# Patient Record
Sex: Female | Born: 1945 | Race: White | Hispanic: No | Marital: Married | Smoking: Never smoker
Health system: Southern US, Community
[De-identification: ages and names within clinical notes are randomized; demographics above are authoritative.]

## PROBLEM LIST (undated history)

## (undated) DIAGNOSIS — R42 Dizziness and giddiness: Secondary | ICD-10-CM

## (undated) DIAGNOSIS — M7500 Adhesive capsulitis of unspecified shoulder: Secondary | ICD-10-CM

## (undated) DIAGNOSIS — G43909 Migraine, unspecified, not intractable, without status migrainosus: Secondary | ICD-10-CM

---

## 1997-12-31 ENCOUNTER — Ambulatory Visit (HOSPITAL_COMMUNITY): Admission: RE | Admit: 1997-12-31 | Discharge: 1997-12-31 | Payer: Self-pay | Admitting: Family Medicine

## 2000-01-22 ENCOUNTER — Other Ambulatory Visit: Admission: RE | Admit: 2000-01-22 | Discharge: 2000-01-22 | Payer: Self-pay | Admitting: Obstetrics and Gynecology

## 2000-02-02 ENCOUNTER — Encounter: Admission: RE | Admit: 2000-02-02 | Discharge: 2000-02-02 | Payer: Self-pay | Admitting: Obstetrics and Gynecology

## 2000-02-02 ENCOUNTER — Encounter: Payer: Self-pay | Admitting: Obstetrics and Gynecology

## 2001-03-16 ENCOUNTER — Other Ambulatory Visit: Admission: RE | Admit: 2001-03-16 | Discharge: 2001-03-16 | Payer: Self-pay | Admitting: Obstetrics and Gynecology

## 2002-05-25 ENCOUNTER — Encounter (INDEPENDENT_AMBULATORY_CARE_PROVIDER_SITE_OTHER): Payer: Self-pay | Admitting: Specialist

## 2002-05-25 ENCOUNTER — Ambulatory Visit (HOSPITAL_COMMUNITY): Admission: RE | Admit: 2002-05-25 | Discharge: 2002-05-25 | Payer: Self-pay | Admitting: Gastroenterology

## 2002-05-29 ENCOUNTER — Other Ambulatory Visit: Admission: RE | Admit: 2002-05-29 | Discharge: 2002-05-29 | Payer: Self-pay | Admitting: *Deleted

## 2002-08-06 ENCOUNTER — Encounter: Payer: Self-pay | Admitting: Obstetrics and Gynecology

## 2002-08-06 ENCOUNTER — Encounter: Admission: RE | Admit: 2002-08-06 | Discharge: 2002-08-06 | Payer: Self-pay | Admitting: *Deleted

## 2003-06-03 ENCOUNTER — Other Ambulatory Visit: Admission: RE | Admit: 2003-06-03 | Discharge: 2003-06-03 | Payer: Self-pay | Admitting: Obstetrics and Gynecology

## 2003-12-10 ENCOUNTER — Ambulatory Visit (HOSPITAL_COMMUNITY): Admission: RE | Admit: 2003-12-10 | Discharge: 2003-12-10 | Payer: Self-pay | Admitting: Family Medicine

## 2004-01-16 ENCOUNTER — Encounter: Admission: RE | Admit: 2004-01-16 | Discharge: 2004-01-16 | Payer: Self-pay | Admitting: Obstetrics and Gynecology

## 2004-06-03 ENCOUNTER — Other Ambulatory Visit: Admission: RE | Admit: 2004-06-03 | Discharge: 2004-06-03 | Payer: Self-pay | Admitting: Obstetrics and Gynecology

## 2005-05-28 ENCOUNTER — Encounter: Admission: RE | Admit: 2005-05-28 | Discharge: 2005-05-28 | Payer: Self-pay | Admitting: Obstetrics and Gynecology

## 2005-07-05 ENCOUNTER — Other Ambulatory Visit: Admission: RE | Admit: 2005-07-05 | Discharge: 2005-07-05 | Payer: Self-pay | Admitting: Obstetrics and Gynecology

## 2005-07-13 ENCOUNTER — Ambulatory Visit (HOSPITAL_COMMUNITY): Admission: RE | Admit: 2005-07-13 | Discharge: 2005-07-13 | Payer: Self-pay | Admitting: Gastroenterology

## 2006-02-21 ENCOUNTER — Ambulatory Visit: Payer: Self-pay | Admitting: Internal Medicine

## 2006-02-23 ENCOUNTER — Ambulatory Visit: Payer: Self-pay | Admitting: Internal Medicine

## 2006-02-25 ENCOUNTER — Inpatient Hospital Stay (HOSPITAL_COMMUNITY): Admission: EM | Admit: 2006-02-25 | Discharge: 2006-02-27 | Payer: Self-pay | Admitting: Emergency Medicine

## 2006-02-25 ENCOUNTER — Ambulatory Visit: Payer: Self-pay | Admitting: Internal Medicine

## 2006-03-03 ENCOUNTER — Ambulatory Visit: Payer: Self-pay

## 2006-03-11 ENCOUNTER — Ambulatory Visit: Payer: Self-pay | Admitting: Internal Medicine

## 2007-03-24 DIAGNOSIS — M810 Age-related osteoporosis without current pathological fracture: Secondary | ICD-10-CM | POA: Insufficient documentation

## 2007-03-24 DIAGNOSIS — Z860100 Personal history of colon polyps, unspecified: Secondary | ICD-10-CM | POA: Insufficient documentation

## 2007-07-18 ENCOUNTER — Other Ambulatory Visit: Admission: RE | Admit: 2007-07-18 | Discharge: 2007-07-18 | Payer: Self-pay | Admitting: Obstetrics & Gynecology

## 2009-01-14 ENCOUNTER — Encounter: Payer: Self-pay | Admitting: Internal Medicine

## 2009-02-13 ENCOUNTER — Other Ambulatory Visit: Admission: RE | Admit: 2009-02-13 | Discharge: 2009-02-13 | Payer: Self-pay | Admitting: Obstetrics and Gynecology

## 2009-02-13 ENCOUNTER — Encounter: Payer: Self-pay | Admitting: Internal Medicine

## 2009-02-24 ENCOUNTER — Ambulatory Visit (HOSPITAL_COMMUNITY): Admission: RE | Admit: 2009-02-24 | Discharge: 2009-02-24 | Payer: Self-pay | Admitting: Obstetrics and Gynecology

## 2009-02-24 ENCOUNTER — Encounter: Payer: Self-pay | Admitting: Obstetrics and Gynecology

## 2009-03-03 ENCOUNTER — Ambulatory Visit (HOSPITAL_COMMUNITY): Admission: RE | Admit: 2009-03-03 | Discharge: 2009-03-03 | Payer: Self-pay | Admitting: Obstetrics and Gynecology

## 2009-03-18 ENCOUNTER — Ambulatory Visit: Payer: Self-pay | Admitting: Internal Medicine

## 2009-03-18 DIAGNOSIS — K3189 Other diseases of stomach and duodenum: Secondary | ICD-10-CM | POA: Insufficient documentation

## 2009-03-18 DIAGNOSIS — R1013 Epigastric pain: Secondary | ICD-10-CM

## 2009-03-18 DIAGNOSIS — E785 Hyperlipidemia, unspecified: Secondary | ICD-10-CM | POA: Insufficient documentation

## 2009-03-18 DIAGNOSIS — E559 Vitamin D deficiency, unspecified: Secondary | ICD-10-CM | POA: Insufficient documentation

## 2009-03-18 LAB — CONVERTED CEMR LAB
Cholesterol, target level: 200 mg/dL
HDL goal, serum: 50 mg/dL

## 2009-03-20 ENCOUNTER — Ambulatory Visit: Payer: Self-pay | Admitting: Internal Medicine

## 2009-03-20 DIAGNOSIS — R142 Eructation: Secondary | ICD-10-CM

## 2009-03-20 DIAGNOSIS — K625 Hemorrhage of anus and rectum: Secondary | ICD-10-CM | POA: Insufficient documentation

## 2009-03-20 DIAGNOSIS — R143 Flatulence: Secondary | ICD-10-CM

## 2009-03-20 DIAGNOSIS — R141 Gas pain: Secondary | ICD-10-CM | POA: Insufficient documentation

## 2009-03-21 ENCOUNTER — Encounter (INDEPENDENT_AMBULATORY_CARE_PROVIDER_SITE_OTHER): Payer: Self-pay | Admitting: *Deleted

## 2009-03-21 LAB — CONVERTED CEMR LAB
Basophils Absolute: 0 10*3/uL (ref 0.0–0.1)
Basophils Relative: 0 % (ref 0.0–3.0)
Eosinophils Relative: 2.6 % (ref 0.0–5.0)
HCT: 39.5 % (ref 36.0–46.0)
MCV: 91.6 fL (ref 78.0–100.0)
Neutro Abs: 2.3 10*3/uL (ref 1.4–7.7)
WBC: 4.6 10*3/uL (ref 4.5–10.5)

## 2009-04-15 ENCOUNTER — Telehealth: Payer: Self-pay | Admitting: Internal Medicine

## 2009-07-22 ENCOUNTER — Encounter: Admission: RE | Admit: 2009-07-22 | Discharge: 2009-07-22 | Payer: Self-pay | Admitting: Obstetrics and Gynecology

## 2009-10-08 ENCOUNTER — Telehealth (INDEPENDENT_AMBULATORY_CARE_PROVIDER_SITE_OTHER): Payer: Self-pay | Admitting: *Deleted

## 2009-10-09 ENCOUNTER — Encounter: Payer: Self-pay | Admitting: Internal Medicine

## 2009-10-30 ENCOUNTER — Encounter: Payer: Self-pay | Admitting: Internal Medicine

## 2009-11-04 ENCOUNTER — Encounter: Payer: Self-pay | Admitting: Internal Medicine

## 2009-11-12 ENCOUNTER — Encounter: Payer: Self-pay | Admitting: Internal Medicine

## 2009-12-16 ENCOUNTER — Ambulatory Visit: Payer: Self-pay | Admitting: Internal Medicine

## 2009-12-18 LAB — CONVERTED CEMR LAB
Cholesterol: 246 mg/dL — ABNORMAL HIGH (ref 0–200)
Direct LDL: 190.3 mg/dL
HDL: 55.8 mg/dL (ref >39.00)
Total CHOL/HDL Ratio: 4
Triglycerides: 79 mg/dL (ref 0.0–149.0)

## 2009-12-24 ENCOUNTER — Encounter: Payer: Self-pay | Admitting: Internal Medicine

## 2009-12-29 ENCOUNTER — Ambulatory Visit: Payer: Self-pay | Admitting: Internal Medicine

## 2009-12-29 DIAGNOSIS — R42 Dizziness and giddiness: Secondary | ICD-10-CM | POA: Insufficient documentation

## 2010-01-05 ENCOUNTER — Telehealth (INDEPENDENT_AMBULATORY_CARE_PROVIDER_SITE_OTHER): Payer: Self-pay | Admitting: *Deleted

## 2010-02-18 ENCOUNTER — Encounter: Admission: RE | Admit: 2010-02-18 | Discharge: 2010-02-18 | Payer: Self-pay | Admitting: Obstetrics and Gynecology

## 2010-08-11 ENCOUNTER — Telehealth (INDEPENDENT_AMBULATORY_CARE_PROVIDER_SITE_OTHER): Payer: Self-pay | Admitting: *Deleted

## 2010-09-25 ENCOUNTER — Telehealth: Payer: Self-pay | Admitting: Internal Medicine

## 2010-10-09 ENCOUNTER — Telehealth: Payer: Self-pay | Admitting: Internal Medicine

## 2010-10-16 ENCOUNTER — Ambulatory Visit: Payer: Self-pay | Admitting: Internal Medicine

## 2010-10-16 LAB — CONVERTED CEMR LAB
ALT: 15 units/L (ref 0–35)
AST: 17 units/L (ref 0–37)
Alkaline Phosphatase: 37 units/L — ABNORMAL LOW (ref 39–117)
BUN: 16 mg/dL (ref 6–23)
CO2: 27 meq/L (ref 19–32)
Eosinophils Absolute: 0.1 10*3/uL (ref 0.0–0.7)
HCT: 39.2 % (ref 36.0–46.0)
Hemoglobin: 13.4 g/dL (ref 12.0–15.0)
Lymphocytes Relative: 28.9 % (ref 12.0–46.0)
Monocytes Relative: 8.4 % (ref 3.0–12.0)
Neutrophils Relative %: 60.4 % (ref 43.0–77.0)
Platelets: 243 10*3/uL (ref 150.0–400.0)
Potassium: 4.3 meq/L (ref 3.5–5.1)
RBC: 4.28 M/uL (ref 3.87–5.11)
RDW: 13.4 % (ref 11.5–14.6)
Sodium: 141 meq/L (ref 135–145)
TSH: 1.54 microintl units/mL (ref 0.35–5.50)

## 2010-10-20 ENCOUNTER — Ambulatory Visit: Payer: Self-pay | Admitting: Internal Medicine

## 2010-10-20 ENCOUNTER — Encounter: Payer: Self-pay | Admitting: Internal Medicine

## 2010-10-20 DIAGNOSIS — R519 Headache, unspecified: Secondary | ICD-10-CM | POA: Insufficient documentation

## 2010-10-20 DIAGNOSIS — H8110 Benign paroxysmal vertigo, unspecified ear: Secondary | ICD-10-CM | POA: Insufficient documentation

## 2010-10-20 DIAGNOSIS — R51 Headache: Secondary | ICD-10-CM

## 2010-10-21 ENCOUNTER — Telehealth (INDEPENDENT_AMBULATORY_CARE_PROVIDER_SITE_OTHER): Payer: Self-pay | Admitting: *Deleted

## 2010-12-21 ENCOUNTER — Encounter: Payer: Self-pay | Admitting: Obstetrics and Gynecology

## 2010-12-29 NOTE — Letter (Signed)
Summary: Hermelinda Medicus MD ENT  Hermelinda Medicus MD ENT   Imported By: Lanelle Bal 12/26/2009 08:34:10  _____________________________________________________________________  External Attachment:    Type:   Image     Comment:   External Document

## 2010-12-29 NOTE — Progress Notes (Signed)
Summary: Med refill  Phone Note Call from Patient Call back at [facility] Phone [phone]   Summary of Call: Patient called the office about denial of her Diazepam, I made her aware of [facility] previous recommendations and patient states that our office has had an incorrect phone number. Patient will be coming home next week and has schedule appt for lab and cpx. She would like enough Diazepam to get her until the appt. Please advise. Initial call taken by: Lucious Groves CMA,  October 09, 2010 4:22 PM  Follow-up for Phone Call        Per MD the patient can be given #30 Diazepam with no refills, and patient must keep labs and cpx appt.  Rx was called in and I confirmed with pharmacy that they number they have for the patient is [phone]. Left message on voicemail to call back to office.  Follow-up by: Lucious Groves CMA,  October 09, 2010 4:32 PM  Additional Follow-up for Phone Call Additional follow up Details #1::        NOTE RX WAS CALLED TO PHARMACY IN [place], NOT LOCAL.  Patient notified. Additional Follow-up by: Lucious Groves CMA,  October 09, 2010 4:47 PM    Prescriptions: DIAZEPAM 5 MG TABS (DIAZEPAM) 1/2  every 8 -12 hrs as needed for "prodrome"  #30 x 0   Entered by:   Lucious Groves CMA   Authorized by:   Marga Melnick MD   Signed by:   Lucious Groves CMA on 10/09/2010   Method used:   Telephoned to ...       [facility] [address] [facility] [address] [id number] (retail)       [address]       [place], [place]  [address]       Ph: [phone]       Fax: [phone]   RxID:   [id number]

## 2010-12-29 NOTE — Progress Notes (Signed)
Summary: phenergan request--Unable to reach patient  Phone Note Call from Patient   Summary of Call: Patient left message on triage that she is still in [place] b/c that is where her husband is working. She notes that she continues to have a problem with Vertigo and Migraines. Patient requests refill of Phenergan be sent to the same [facility] as last time. Please advise. Initial call taken by: Lucious Groves CMA,  September 25, 2010 11:57 AM  Follow-up for Phone Call        Phenergan suppositories 12.5 mg # 5.Unfortunately I will not be able to  continue refill meds  because she has not been seen since 11/2009 & followup of vit D deficiency  & cholesterol were due in 03/2010. The [place] [employer] Board will not sanction me  refilling meds on ongoing basis  with her being out of state as that is not Standard of Care as per the Board. She needs to establish with a MD there who can monitor these issues to protect her health. Follow-up by: Marga Melnick MD,  September 25, 2010 12:58 PM  Additional Follow-up for Phone Call Additional follow up Details #1::        Attempted to reach patient and the phone will ring and then start beeping. Must try again. Lucious Groves CMA  September 25, 2010 3:37 PM   Faxed prescription and attempted to contact the patient again. Same as above. Lucious Groves CMA  September 25, 2010 5:02 PM   Tried to contact pt still unable to reach will try again later............Marland KitchenFelecia Deloach CMA  September 28, 2010 9:18 AM     Additional Follow-up for Phone Call Additional follow up Details #2::    Still unable to contact the patient, phone rang and then cuts off. Lucious Groves CMA  September 29, 2010 8:50 AM   Still the same, phone rings then cuts off. Cannot send note b/c patient is out of state. Closed phone note until patient calls again. Lucious Groves CMA  September 30, 2010 3:24 PM   New/Updated Medications: PROMETHEGAN 12.5 MG SUPP (PROMETHAZINE HCL) use as directed Prescriptions: PROMETHEGAN  12.5 MG SUPP (PROMETHAZINE HCL) use as directed  #5 x 0   Entered by:   Lucious Groves CMA   Authorized by:   Marga Melnick MD   Signed by:   Lucious Groves CMA on 09/25/2010   Method used:   Printed then faxed to ...       [facility] [id number]* (retail)       [address]       [place], [place]  [address]       Ph: [phone]       Fax: [phone]   RxID:   [id number]

## 2010-12-29 NOTE — Letter (Signed)
Summary: 11-05-09 thru 12-16-09 Kathline Magic MD  11-05-09 thru 12-16-09 Kathline Magic MD   Imported By: Lanelle Bal 01/05/2010 09:12:13  _____________________________________________________________________  External Attachment:    Type:   Image     Comment:   External Document

## 2010-12-29 NOTE — Assessment & Plan Note (Signed)
Summary: 4 mth fu/discuss labs/kdc   Vital Signs:  Patient profile:   65 year old female Weight:      132.8 pounds Pulse rate:   84 / minute Resp:     16 per minute BP sitting:   122 / 80  (left arm) Cuff size:   large  Vitals Entered By: Shonna Chock (December 29, 2009 4:32 PM) CC: 1.) F/U on labs  2.) Follow-up from [facility] Comments REVIEWED MED LIST, PATIENT AGREED DOSE AND INSTRUCTION CORRECT    CC:  1.) F/U on labs  2.) Follow-up from [facility].  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  Sensation of claustrophobia & being "hot" as "prodrome " before vertigo.Onset  while driving w/o trigger . Dr Allayne Stack & Dr Ewell Poe notes reviewed. No change in symptoms on 60 mg Prednisone daily. She questions role of lipids. Lorazepam helped vertigo in [place] in 08/2009. Lipids reviewed : LDL 190.3 with TG 79, HDL 21.30. NMR  2007 reviewed : LDL goal = < 150. PMH of Lipitor intolerance in 2003. On low salt , low caffeine diet.  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 Neuro:  Complains of visual disturbances; denies brief paralysis, numbness, tingling, and weakness; Dr Sandria Manly questioned migraine variant . Floaters .  Physical Exam  General:  well-nourished,in no acute distress; alert,appropriate and cooperative throughout examination Eyes:  No corneal or conjunctival inflammation noted. EOMI. Perrla.  No nystagmus.Minimal ptosis OS Lungs:  Normal respiratory effort, chest expands symmetrically. Lungs are clear to auscultation, no crackles or  wheezes. Heart:  Normal rate and regular rhythm. S1 and S2 normal without gallop, murmur, click, rub or other extra sounds. Pulses:  R and L carotid,radial,dorsalis pedis and posterior tibial pulses are full and equal bilaterally Neurologic:  alert & oriented X3, strength normal in all extremities, and DTRs symmetrical and normal.   Skin:  Intact without suspicious lesions or rashes Psych:  memory intact for recent and remote and good eye contact.     Impression & Recommendations:  Problem # 1:  VERTIGO (ICD-780.4)  Her updated medication list for this problem includes:    Promethazine Hcl 12.5 Mg Supp (Promethazine hcl) .Marland Kitchen... As needed    Dramamine 50 Mg Tabs (Dimenhydrinate) .Marland Kitchen... As needed    Phenadoz 12.5 Mg Supp (Promethazine hcl) .Marland Kitchen... 1 q 8-12 hrs as needed  Problem # 2:  HYPERLIPIDEMIA (ICD-272.4) LDL climbing  Problem # 3:  VITAMIN D DEFICIENCY (ICD-268.9) off 50,000 International Units once weekly  X 3 months( she feared  it was aggravating vertigo); vit D 24 after 6 months of vit D  Complete Medication List: 1)  Vitamin D 86578 Unit Caps (Ergocalciferol) .Marland Kitchen.. 1 by mouth every monday 2)  Ranitidine Hcl 150 Mg Tabs (Ranitidine hcl) .Marland Kitchen.. 1 every 12 hrs pre meals two times a day prn 3)  Prednisone 20 Mg Tabs (Prednisone) .... 3 by mouth once daily 4)  Promethazine Hcl 12.5 Mg Supp (Promethazine hcl) .... As needed 5)  Dramamine 50 Mg Tabs (Dimenhydrinate) .... As needed 6)  Diazepam 5 Mg Tabs (Diazepam) .... 1/2  every 8 -12  hrs as needed for "prodrome" 7)  Phenadoz 12.5 Mg Supp (Promethazine hcl) .Marland Kitchen.. 1 q 8-12 hrs as needed  Patient Instructions: 1)  Vitamin D3  2000 International Units once daily . REad Dr Gildardo Griffes Eat, Drink & Be Healthy . 2)  Please schedule a follow-up appointment in 3 months. 3)  NMR Lipoprofile Lipid Panel prior to visit, ICD-9:272.4 4)  HbgA1C prior to visit, ICD-9:995.20, 272.4 Prescriptions: PHENADOZ 12.5 MG SUPP (PROMETHAZINE HCL) 1 q 8-12 hrs  as needed  #12 x 0   Entered and Authorized by:   Marga Melnick MD   Signed by:   Marga Melnick MD on 12/29/2009   Method used:   Print then Give to Patient   RxID:   [id number] DIAZEPAM 5 MG TABS (DIAZEPAM) 1/2  every 8 -12 hrs as needed for "prodrome"  #30 x 2   Entered and Authorized by:   Marga Melnick MD   Signed by:   Marga Melnick MD on 12/29/2009   Method used:   Print then Give to Patient   RxID:   [id number]   Appended Document: Orders Update    Clinical Lists Changes  Orders: Added new Test order of Durable Medical Equipment (DME) - Signed

## 2010-12-29 NOTE — Letter (Signed)
Summary: Hermelinda Medicus MD  Hermelinda Medicus MD   Imported By: Lanelle Bal 01/05/2010 09:09:45  _____________________________________________________________________  External Attachment:    Type:   Image     Comment:   External Document

## 2010-12-29 NOTE — Progress Notes (Signed)
Summary: refill too early   Phone Note Refill Request Message from:  Fax from Pharmacy  Refills Requested: Medication #1:  DIAZEPAM 5 MG TABS 1/2  every 8 -12 hrs as needed for "prodrome" rx written [phone] - note from [facility] [address] -fax [phone] - tel [phone] "please check with md if 7 days early refill ok. According to her profile no early refills in past."  Initial call taken by: Okey Regal [place],  October 21, 2010 10:57 AM  Follow-up for Phone Call        Spoke with Leonie Man and informed her ok to fill 7days early Follow-up by: Shonna Chock CMA,  October 21, 2010 11:11 AM

## 2010-12-29 NOTE — Letter (Signed)
Summary: 10-16-09 & 10-30-09 Okey Regal MD  10-16-09 & 10-30-09 Okey Regal MD   Imported By: Lanelle Bal 01/05/2010 09:10:45  _____________________________________________________________________  External Attachment:    Type:   Image     Comment:   External Document

## 2010-12-29 NOTE — Assessment & Plan Note (Signed)
Summary: CPX/kb   Vital Signs:  Patient profile:   65 year old female Height:      63 inches Weight:      132.4 pounds BMI:     23.54 Temp:     97.6 degrees F oral Pulse rate:   76 / minute Resp:     14 per minute BP sitting:   118 / 72  (left arm) Cuff size:   large  Vitals Entered By: Shonna Chock CMA (October 20, 2010 3:55 PM) CC: CPX and discuss labs (copy given)    CC:  CPX and discuss labs (copy given) .  History of Present Illness:      Kelly Rocha is here for a physical; she has had recurrence of  migraines & BPV symptoms in the context of significant life stresses. She has been in [place]  for a protracted time  related to  her husband's job .The evaluations completed by ENT, Neuro & Rheumatology were reviewed.  Current Medications (verified): 1)  Vitamin D 32440 Unit Caps (Ergocalciferol) .Marland Kitchen.. 1 By Mouth Every Monday 2)  Ranitidine Hcl 150 Mg Tabs (Ranitidine Hcl) .Marland Kitchen.. 1 Every 12 Hrs Pre Meals Two Times A Day Prn 3)  Diazepam 5 Mg Tabs (Diazepam) .... 1/2  Every 8 -12 Hrs As Needed For "prodrome" 4)  Promethegan 12.5 Mg Supp (Promethazine Hcl) .... Use As Directed  Allergies (verified): No Known Drug Allergies  Past History:  Past Medical History: Colonic polyps, PMH  of, Dr Madilyn Fireman Osteoporosis; Vitamin D deficiency  Hyperlipidemia: NMR Lipoprofile : 165(1417/25), HDL 68, TG 96. LDL goal = < 150. 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 migraine variant, Dr Sandria Manly  Past Surgical History: G3 P3; Chest wall cystectomy;C section X2; Hysteroscopy for cervica/ uterine l polyps Colon polypectomy 2004, negative  2007, Dr Madilyn Fireman  Family History: Father: MI @ 69,Cardiomyopathy Mother: stomach cancer, lipids Siblings: sister: HH,GERD, OA; son :HTN, lipids; MGM: melanoma daughter : MS; son: post traumatic cns injury  Social History: Married Never Smoked Alcohol use: no Regular exercise: no due to  BPV; Atkin's type diet when LDL  was 190.("I married a [place]!")  Review of Systems General:  Complains of fatigue; denies chills, fever, sweats, and weight loss. Eyes:  Denies double vision and vision loss-both eyes; Blurring of eyes with headache; diplopia X 1 episode with vertigo & headache. ENT:  Complains of hoarseness and ringing in ears; denies decreased hearing and difficulty swallowing; Tinnitus in Sept & Oct  before vertigo. CV:  Denies chest pain or discomfort, leg cramps with exertion, shortness of breath with exertion, swelling of feet, and swelling of hands; Rare "flip" to beat. Resp:  Denies cough, shortness of breath, and sputum productive. GI:  Denies abdominal pain, bloody stools, dark tarry stools, and indigestion; Colonoscopy next week for rectal bleeding ( in 12/2009). GU:  Denies discharge, dysuria, and hematuria. MS:  Denies joint pain, joint redness, and joint swelling. Derm:  Denies changes in nail beds, dryness, hair loss, and lesion(s). Neuro:  Denies brief paralysis, numbness, tingling, tremors, and weakness. Psych:  Complains of anxiety; denies depression, easily angered, easily tearful, and irritability; her daughter who has MS & son with cns trauma both live @ home.  Physical Exam  General:  well-nourished;alert,appropriate and cooperative throughout examination Head:  Normocephalic and atraumatic without obvious abnormalities.  Eyes:  No corneal or conjunctival inflammation noted. EOMI. Perrla. Funduscopic exam benign, without hemorrhages,  exudates or papilledema. Field of  Vision grossly normal. Ears:  External ear exam shows no significant lesions or deformities.  Otoscopic examination reveals clear canals, tympanic membranes are intact bilaterally without bulging, retraction, inflammation or discharge. Hearing is grossly normal bilaterally. Nose:  External nasal examination shows no deformity or inflammation. Nasal mucosa are pink and moist without lesions or  exudates. Mouth:  Oral mucosa and oropharynx without lesions or exudates.  Teeth in good repair. Neck:  No deformities, masses, or tenderness noted. Lungs:  Normal respiratory effort, chest expands symmetrically. Lungs are clear to auscultation, no crackles or wheezes. Heart:  Normal rate and regular rhythm. S1 and S2 normal without gallop, murmur, click, rub or other extra sounds. Abdomen:  Bowel sounds positive,abdomen soft and non-tender without masses, organomegaly or hernias noted. Genitalia:  Dr Tresa Res  Msk:  No deformity or scoliosis noted of thoracic or lumbar spine.   Pulses:  R and L carotid,radial,dorsalis pedis and posterior tibial pulses are full and equal bilaterally Extremities:  No clubbing, cyanosis, edema, or deformity noted with normal full range of motion of all joints.   Neurologic:  alert & oriented X3, cranial nerves II-XII intact, strength normal in all extremities, sensation intact to light touch, and DTRs symmetrical and normal.   Skin:  Intact without suspicious lesions or rashes Cervical Nodes:  No lymphadenopathy noted Axillary Nodes:  No palpable lymphadenopathy Psych:  memory intact for recent and remote, normally interactive, and good eye contact.     Impression & Recommendations:  Problem # 1:  PHYSICAL EXAMINATION (ICD-V70.0)  Orders: EKG w/ Interpretation (93000)  Problem # 2:  VERTIGO (ICD-780.4) Prior evaluation by Dr Haroldine Laws, ENT The following medications were removed from the medication list:    Promethazine Hcl 12.5 Mg Supp (Promethazine hcl) .Marland Kitchen... As needed    Dramamine 50 Mg Tabs (Dimenhydrinate) .Marland Kitchen... As needed    Phenadoz 12.5 Mg Supp (Promethazine hcl) .Marland Kitchen... 1 q 8-12 hrs as needed Her updated medication list for this problem includes:    Promethegan 12.5 Mg Supp (Promethazine hcl) ..... Use as directed  Problem # 3:  HEADACHE (ICD-784.0) flare with life stresses; PMH of Neuro evaluation by Dr Sandria Manly  Problem # 4:  HYPERLIPIDEMIA  (ICD-272.4) LDL goal = < 150, elevated @ last evaluation ? due to  increased meat intake  Complete Medication List: 1)  Vitamin D 16109 Unit Caps (Ergocalciferol) .Marland Kitchen.. 1 by mouth every monday 2)  Ranitidine Hcl 150 Mg Tabs (Ranitidine hcl) .Marland Kitchen.. 1 every 12 hrs pre meals two times a day prn 3)  Diazepam 5 Mg Tabs (Diazepam) .... 1/2  every 8 -12 hrs as needed for "prodrome" 4)  Promethegan 12.5 Mg Supp (Promethazine hcl) .... Use as directed  Patient Instructions: 1)  Review Dr Gildardo Griffes book Eat, Drink & Be Healthy; after 4 months repeat fasting lipids( 272.4) Prescriptions: PROMETHEGAN 12.5 MG SUPP (PROMETHAZINE HCL) use as directed  #5 x 1   Entered and Authorized by:   Marga Melnick MD   Signed by:   Marga Melnick MD on 10/20/2010   Method used:   Print then Give to Patient   RxID:   [id number] DIAZEPAM 5 MG TABS (DIAZEPAM) 1/2  every 8 -12 hrs as needed for "prodrome"  #90 x 1   Entered and Authorized by:   Marga Melnick MD   Signed by:   Marga Melnick MD on 10/20/2010   Method used:   Print then Give to Patient   RxID:   [id number]  Orders Added: 1)  Est. Patient 40-64 years [99396] 2)  EKG w/ Interpretation [93000]

## 2010-12-29 NOTE — Progress Notes (Signed)
Summary: refill  Phone Note Refill Request Message from:  Fax from Pharmacy on [facility] [address] fax [phone]  Refills Requested: Medication #1:  RANITIDINE HCL 150 MG TABS 1 every 12 hrs pre meals two times a day prn Initial call taken by: Barb Merino,  January 05, 2010 1:55 PM    Prescriptions: RANITIDINE HCL 150 MG TABS (RANITIDINE HCL) 1 every 12 hrs pre meals two times a day prn  #60 x 11   Entered by:   Shonna Chock   Authorized by:   Marga Melnick MD   Signed by:   Shonna Chock on 01/05/2010   Method used:   Electronically to        [facility] [address] (retail)       [phone]       [place], [place]  [address]       Ph: [phone]       Fax: [phone]   RxID:   [id number]

## 2010-12-29 NOTE — Progress Notes (Signed)
Summary: rx for phenergan  Phone Note Call from Patient Call back at Home Phone [phone]   Caller: Patient Summary of Call: patient called says she is currently in [place], [place] and says that she takes uses phenergan supp for migraines and would like to have prescription called in to pharmacy [facility] in [place] [phone] Initial call taken by: Doristine Devoid CMA,  August 11, 2010 3:12 PM  Follow-up for Phone Call        #5 Rxed on recorder as Pharmacist never picked up Follow-up by: Marga Melnick MD,  August 11, 2010 5:55 PM  Additional Follow-up for Phone Call Additional follow up Details #1::        I tried to call patient several times to inform her meds called in, the call would not go through.  I will try again later Additional Follow-up by: Shonna Chock CMA,  August 12, 2010 8:27 AM    Additional Follow-up for Phone Call Additional follow up Details #2::    I tried to reach patient again, call still would not go through. Phone would ring a few times and then just hang up Follow-up by: Shonna Chock CMA,  August 12, 2010 12:59 PM  Additional Follow-up for Phone Call Additional follow up Details #3:: Details for Additional Follow-up Action Taken: I tried to reach patient again, call unable to go through, If patient calls back I will let her know rx was called in .Shonna Chock CMA  August 13, 2010 1:32 PM

## 2011-01-01 NOTE — Letter (Signed)
Summary: [facility]   Imported By: Lanelle Bal 01/02/2010 14:34:54  _____________________________________________________________________  External Attachment:    Type:   Image     Comment:   External Document

## 2011-03-11 LAB — CBC
MCHC: 34.1 g/dL (ref 30.0–36.0)
Platelets: 256 10*3/uL (ref 150–400)

## 2011-03-12 ENCOUNTER — Encounter: Payer: Self-pay | Admitting: Internal Medicine

## 2011-03-12 ENCOUNTER — Ambulatory Visit (INDEPENDENT_AMBULATORY_CARE_PROVIDER_SITE_OTHER): Payer: Self-pay | Admitting: Internal Medicine

## 2011-03-12 DIAGNOSIS — E785 Hyperlipidemia, unspecified: Secondary | ICD-10-CM

## 2011-03-12 DIAGNOSIS — R51 Headache: Secondary | ICD-10-CM

## 2011-03-12 DIAGNOSIS — R002 Palpitations: Secondary | ICD-10-CM

## 2011-03-12 DIAGNOSIS — R42 Dizziness and giddiness: Secondary | ICD-10-CM

## 2011-03-12 LAB — POTASSIUM: Potassium: 5.2 mEq/L (ref 3.5–5.3)

## 2011-03-12 MED ORDER — PROMETHAZINE HCL 25 MG RE SUPP: 25.0000 mg | Freq: Four times a day (QID) | RECTAL | Status: DC | PRN

## 2011-03-12 NOTE — Assessment & Plan Note (Signed)
NMR Lipoprofile:LDL 165(1417/25), HDL 68, TG 96. LDL gola= < 150, ideally < 110.

## 2011-03-12 NOTE — Progress Notes (Signed)
Subjective:    Patient ID: Kelly Rocha, female    DOB: 02/21/1946, 65 y.o.   MRN: [id number]  HPI Dyslipidemia assessment   : She has been off any statin for 10 years.                                               Prior advanced Lipid testing : NMR Lipoprofile 2007:LDL 165(1417 / 25), HDL 68, TG 96. Results were reviewed & discussed. [place] Heart Panel none to date .                                                      Family history of premature CAD/ KZ:SWFUX ; but her  Father had MI  @ 71                                                                               Nutrition: "[place] food " X 1 year( increased meat)   Exercise: no  Diabetes : no   Smoking : never   HTN: no     Weight :  stable               ROS: chest pain : non exertional tightness occasionally ;claudication: no; palpitations: increased in past 10 days; syncope : in 12/2010 with migraine ; skin/ hair/ nail  changes: no; intermittent lightheadedness & dizziness . The dizziness has been evaluated by Dr Haroldine Laws , ENT & Dr Sandria Manly , Neuro.  Her migraines are poorly controlled. One episode was so severe she had visual changes and paresthesias in the extremity and severe nausea and vomiting. I stressed that she should go back to Dr. Sandria Manly or seek a second opinion from  a headache clinic.  The Diazepam  has helped her dizziness until recently. She has added over-the-counter anti-vertigo medications to supplement the Diazepam.    Review of Systems:      Objective:   Physical Exam on exam she is in no acute distress. Her speech seems slightly slurred. She has some difficulty providing history and focusing on questions.  She has no scleral icterus or jaundice.  Extraocular motion was intact. No nystagmus was present.  No carotid bruits were noted. All pulses were intact.  An S4 was noted; she had no significant murmurs or gallops.  Chest was clear to auscultation with no rales, rhonchi, or wheezes.  Extremities revealed no cyanosis,  edema, or clubbing.  She was very critical of herself stating that she had "failed many in reference to exercise and nutrition interventions.  She had difficulty grasping concepts that although her LDL was very high; the particle composition suggested that an LDL of 150 would be an  adequate goal. She kept focusing on the fact that her triglycerides have been elevated at the last lipid profile.        Assessment & Plan:  #1 Dyslipidemia  #2 palpitations  #  3 migraines 4 controlled  #4 dizziness; most likely part of the migraine picture  Plan: #1 EKG  #2 potassium, magnesium, calcium, and TSH to evaluate the palpitations   #3 a low glycemic carb diet was  recommended such as the book The New Sugar Busters. Avoidance of high fructose corn syrup sugar was stressed..  An advanced cholesterol panel ([place] heart panel) series will be ordered to optimally assess her long-term lipid risk. I feel this is necessary as she seems to be fixated on cholesterol risk despite review of the NMR Lipoprofile Panel results l results.

## 2011-03-12 NOTE — Patient Instructions (Signed)
The most common cause of elevated triglycerides is the ingestion of sugar from high fructose corn syrup sources. You should consume less than 40 grams  of sugar per day from foods and drinks with high fructose corn syrup as number 2, 3, or #4 on the label.  Please schedule a fasting Boston panel (1304X)

## 2011-03-15 ENCOUNTER — Other Ambulatory Visit: Payer: Self-pay

## 2011-03-16 ENCOUNTER — Other Ambulatory Visit: Payer: Self-pay

## 2011-03-18 ENCOUNTER — Other Ambulatory Visit: Payer: Self-pay

## 2011-03-18 ENCOUNTER — Telehealth: Payer: Self-pay | Admitting: *Deleted

## 2011-03-18 DIAGNOSIS — R42 Dizziness and giddiness: Secondary | ICD-10-CM

## 2011-03-18 NOTE — Telephone Encounter (Signed)
Spoke w/ pt informed that referral to be placed to see neurologist.

## 2011-03-18 NOTE — Telephone Encounter (Signed)
Left msg for return call

## 2011-03-30 ENCOUNTER — Encounter: Payer: Self-pay | Admitting: Internal Medicine

## 2011-03-31 ENCOUNTER — Ambulatory Visit: Payer: Self-pay | Admitting: Internal Medicine

## 2011-04-05 ENCOUNTER — Other Ambulatory Visit (INDEPENDENT_AMBULATORY_CARE_PROVIDER_SITE_OTHER): Payer: Self-pay

## 2011-04-05 DIAGNOSIS — Z1211 Encounter for screening for malignant neoplasm of colon: Secondary | ICD-10-CM

## 2011-04-05 LAB — HEMOCCULT GUIAC POC 1CARD (OFFICE): Card #2 Fecal Occult Blod, POC: NEGATIVE

## 2011-04-08 ENCOUNTER — Ambulatory Visit (INDEPENDENT_AMBULATORY_CARE_PROVIDER_SITE_OTHER): Payer: Self-pay | Admitting: Internal Medicine

## 2011-04-08 ENCOUNTER — Encounter: Payer: Self-pay | Admitting: Internal Medicine

## 2011-04-08 DIAGNOSIS — H9209 Otalgia, unspecified ear: Secondary | ICD-10-CM

## 2011-04-08 DIAGNOSIS — H9202 Otalgia, left ear: Secondary | ICD-10-CM

## 2011-04-08 DIAGNOSIS — E785 Hyperlipidemia, unspecified: Secondary | ICD-10-CM

## 2011-04-08 DIAGNOSIS — R42 Dizziness and giddiness: Secondary | ICD-10-CM

## 2011-04-08 DIAGNOSIS — R079 Chest pain, unspecified: Secondary | ICD-10-CM

## 2011-04-08 LAB — CK TOTAL AND CKMB (NOT AT ARMC): Total CK: 67 U/L (ref 7–177)

## 2011-04-08 NOTE — Patient Instructions (Signed)
Stop the ranitidine; take Nexium 40 mg 30 minutes before breakfast.The triggers for dyspepsia or "heart burn"  include stress; the "aspirin family" ; alcohol; peppermint; and caffeine (coffee, tea, cola, and chocolate). The aspirin family would include aspirin and the nonsteroidal agents such as ibuprofen &  Naproxen. Tylenol would not cause reflux. If having dyspepsia ; food & dink should be avoided for @ least 2 hors before going to bed.   Use warm compresses or Zostrix cream on the tender chest area. Do not put warm compresses over the Zostrix cream.  Please review the advanced cholesterol panels which we discussed in detail. If you wish we could use pravastatin 20 mg at bedtime and recheck fasting lipids and hepatic panel after 10 weeks. (272.4, 995.2)

## 2011-04-08 NOTE — Assessment & Plan Note (Signed)
NMR Lipoprofile  2007: LDL 165 ( 1417/25), HDL 68, TG 96. LDL goal = < 150, ideally < 120. Father MI @ 39

## 2011-04-08 NOTE — Progress Notes (Signed)
Subjective:    Patient ID: Kelly Rocha, female    DOB: Apr 13, 1946, 65 y.o.   MRN: [id number]  HPI EAR PAIN Location: left   Onset: 2 weeks ago , sharp  intermittent every day  Symptoms  Sensation of fullness: yes, pressure which has been  Constant X 2 weeks Ear discharge: no URI symptoms: no  Fever: no    Tinnitus: yes, a chronic recurrent issue Dizziness: yes, as part of migraine picture Hearing loss: no Headache: no Toothache: no Jaw click: no, but pain behind L ear Rashes or lesions: no Facial muscle weakness: no  Red Flags Recent trauma: no PMH recurrent OM: no  PMH prior ear surgery: no , but L TM ruptured with URI  Recent antibiotic usage (last 30 days):  no Diabetes or Immunosuppresion:  no, she took steroids over 2 weeks 6 mos ago   CHEST PAIN Location: SS Quality: dull Duration: > 1 hr Onset (rest, exertion): today @ rest with dizziness Radiation: no Better with: no treatment Worse with: no trigger; it came on after lying down because of near syncope due to lightheadedness Symptoms History of Trauma/lifting: no  Nausea/vomiting: yes, due to migraine  Diaphoresis: no  Shortness of breath: no  Pleuritic: no  Cough: no  Edema: no  Orthopnea: no  PND: no  Dizziness: yes, dizziness preceded chest pain  Palpitations: yes, "pauses then beats", a chronic issue  Syncope: no  Indigestion: yes, Pepto Bismol helped Red Flags Worse with exertion: no  Recent Immobility: no  Tearing/radiation to back: no Her father had history of cardiomegaly; he died of heart attack at 16.     Her [facility] panel was reviewed & risks discussed. Only risk = LDL > 150.  Review of Systems   She has had chronic bloating  For . 12 months; ranitidine helped  reflux but not the bloating. She is overdue for her gynecologic care; she was last seen in March/2011     Objective:   Physical Exam General appearance is of good health and nourishment; no acute distress or increased work of  breathing is present.  No  lymphadenopathy about the head, neck, or axilla noted.   Eyes: No conjunctival inflammation or lid edema is present. There is no scleral icterus.  Ears:  External ear exam shows no significant lesions or deformities.  Otoscopic examination reveals  Wax on RIGHT; L  tympanic membrane  Is  intact  without bulging, retraction, inflammation or discharge. Mastoid not tender  Nose:  External nasal examination shows no deformity or inflammation. Nasal mucosa are pink and moist without lesions or exudates. No septal dislocation or dislocation.No obstruction to airflow.   Oral exam: Dental hygiene is good; lips and gums are healthy appearing.There is no oropharyngeal erythema or exudate noted.   Neck:  No deformities, thyromegaly, masses, or tenderness noted.   Supple with full range of motion without pain.   Heart:  Normal rate and regular rhythm. S1 and S2 normal without gallop, murmur, click, rub or other extra sounds.   Lungs:Chest clear to auscultation; no wheezes, rhonchi,rales ,or rubs present.No increased work of breathing.    Classic chest wall tenderness is noted at the left sternal border.  Abdomen: bowel sounds normal, soft but diffusely slightly tender without masses, organomegaly or hernias noted.  No guarding or rebound   Extremities:  No cyanosis, edema, or clubbing  noted    Skin: Warm & dry w/o jaundice or tenting.  Assessment & Plan:  #1 ear pain; the dermatologic exam is unremarkable. She questions whether ear pain may be a migraine variant. She has an appointment for  migraine evaluation at [facility] in the near future.  #2 chest pain; occurring at rest. 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 EKG reveals low voltage which is related to her body habitus (breast tissue opacities no ischemic changes are present.  #3 bloating, chronic. Negative ovarian evaluation by past history.  Plan: #1 complete neurologic evaluation  at [facility]  #2 enzymes will be checked; clinically costochondritis is present  #3 trial of Nexium 40 mg daily as samples.

## 2011-04-13 ENCOUNTER — Encounter: Payer: Self-pay | Admitting: Internal Medicine

## 2011-04-13 ENCOUNTER — Telehealth: Payer: Self-pay

## 2011-04-13 NOTE — Telephone Encounter (Signed)
Pt says she is having problems w/ migraines and since she is taking diazepam would like to know what she could take for the pain that wont cause an interaction w/ medication.

## 2011-04-13 NOTE — Op Note (Signed)
NAME:  Kelly Rocha, Kelly Rocha                  ACCOUNT NO.:  [id number]   MEDICAL RECORD NO.:  [id number]          PATIENT TYPE:  AMB   LOCATION:  SDC                           FACILITY:  [facility]   PHYSICIAN:  Cynthia P. Romine, M.D.DATE OF BIRTH:  04-28-1946   DATE OF PROCEDURE:  02/24/2009  DATE OF DISCHARGE:                               OPERATIVE REPORT   PREOPERATIVE DIAGNOSIS:  Postmenopausal bleeding.   POSTOPERATIVE DIAGNOSIS:  Postmenopausal bleeding, path pending.   PROCEDURE:  Hysteroscopic resection of polyp, D&C.   SURGEON:  Cynthia P. Romine, MD   ANESTHESIA:  General by LMA.   ESTIMATED BLOOD LOSS:  Minimal.   INTRAVENOUS FLUIDS:  Sorbitol deficit, 50 mL.   COMPLICATIONS:  None.   PROCEDURE:  The patient was taken to the operating room and after  induction of adequate general anesthesia by LMA was placed in dorsal  lithotomy position and prepped and draped in usual fashion.  The bladder  was drained with a red rubber catheter.  Posterior weighted and anterior  Sims retractors were placed and the cervix was grasped on its anterior  lip with a single-tooth tenaculum.  Paracervical block was instituted by  injecting 10 mL of 1% Xylocaine at each of 3 and 9 o'clock.  The uterus  was then sounded to 7 cm.  It was quite posterior.  The cervix was  dilated to #31 Shawnie Pons.  The operative hysteroscope was introduced.  Sorbitol was used as a distention medium and the pressure pump was set  at 80 mmHg.  Hysteroscopy revealed there to be a single polyp in the  endometrium and, otherwise, it appeared quite atrophic.  The tubal ostia  could be clearly seen.  Photographic documentation was taken of the  polyp and then of the cavity after resection of the polyp.  The scope  was withdrawn.  Gentle sharp curettage was done with minimal tissue  obtained.  Instruments removed from the vagina and the procedure was  terminated.  The patient tolerated it well and went in satisfactory  condition to  postanesthesia recovery.      Cynthia P. Romine, M.D.  Electronically Signed     CPR/MEDQ  D:  02/24/2009  T:  02/25/2009  Job:  [id number]

## 2011-04-13 NOTE — Telephone Encounter (Signed)
If not allergic, gabapentin 100 mg every 8 hours as needed  ( #30)would be the safest treatment. The usual treatment for migraines, tryptans, would be contraindicated because of the other health issues present.Please keep a diary of your headaches . Document  each occurrence on the calendar with notation of : #1 any prodrome ( any non headache symptom such as marked fatigue,visual changes, ,etc ) which precedes actual headache ; #2) severity on 1-10 scale; #3) any triggers ( food/ drink,enviromenntal or weather changes ,physical or emotional stress) in 8-12 hour period prior to the headache; & #4) response to any medications or other intervention. Please review "Headache" @ [facility] for additional information.

## 2011-04-14 MED ORDER — GABAPENTIN 100 MG PO CAPS: ORAL_CAPSULE | ORAL | Status: DC

## 2011-04-14 NOTE — Telephone Encounter (Signed)
Spoke w/ pt aware of labs and recommendations about medication along w/ headaches.

## 2011-04-14 NOTE — Telephone Encounter (Signed)
Left msg for pt to return call.

## 2011-04-16 NOTE — Procedures (Signed)
[facility]  Patient:    ODESTER, NILSON Visit Number: [id number] MRN: [id number]          Service Type: END Location: ENDO Attending Physician:  Louie Bun Dictated by:   Everardo All Madilyn Fireman, M.D. Proc. Date: 05/25/02 Admit Date:  05/25/2002   CC:         Duncan Dull, M.D.   Procedure Report  PROCEDURE:  Colonoscopy with polypectomy.  INDICATION FOR PROCEDURE:  Family history of colon cancer in a first-degree relative.  DESCRIPTION OF PROCEDURE:  The patient was placed in the left lateral decubitus position and placed on the pulse monitor with continuous low-flow oxygen delivered by nasal cannula.  She was sedated with 70 mg of IV Demerol and 8 mg of IV Versed.  The Olympus colonoscope was inserted into the rectum and advanced to the cecum, confirmed by transillumination at McBurneys point and visualization of the ileocecal valve and appendiceal orifice.  The prep was excellent.  The cecum appeared normal.  On the distal side of the ileocecal valve, there was a flat, sessile polyp, approximately 1 cm in diameter that was removed by snare.  The remainder of the ascending, transverse, descending, sigmoid, and rectum appeared normal with no further polyps, masses, diverticula. or other mucosal abnormalities.  The rectum likewise appeared normal, and retroflexed view of the anus revealed no obvious internal hemorrhoids.  The colonoscope was then withdrawn, and the patient returned to the recovery room in stable condition.  She tolerated the procedure well, and there were no immediate complications.  IMPRESSION:  Ascending colon polyp.  PLAN:  Await histology for determination of interval for next colonoscopy. Dictated by:   Everardo All Madilyn Fireman, M.D. Attending Physician:  Louie Bun DD:  05/25/02 TD:  05/26/02 Job: [id number]

## 2011-04-16 NOTE — Op Note (Signed)
NAME:  Kelly Rocha, Kelly Rocha                  ACCOUNT NO.:  [id number]   MEDICAL RECORD NO.:  [id number]          PATIENT TYPE:  AMB   LOCATION:  ENDO                         [facility]  [facility]   PHYSICIAN:  John C. Madilyn Fireman, M.D.    DATE OF BIRTH:  02-May-1946   DATE OF PROCEDURE:  07/13/2005  DATE OF DISCHARGE:                                 OPERATIVE REPORT   PROCEDURE:  Colonoscopy.   INDICATIONS FOR PROCEDURE:  Personal history of adenomatous colon polyps and  a family history of colon polyps in a first-degree relative.   DESCRIPTION OF PROCEDURE:  The patient was placed in the left lateral  decubitus position and placed on the pulse monitor with continuous low-flow  oxygen delivered by nasal cannula. She was sedated with 60 mcg IV fentanyl  and 6 mg IV Versed. The Olympus video colonoscope was inserted into the  rectum and advanced to the cecum, confirmed by transillumination at  McBurney's point and visualization of the ileocecal valve and appendiceal  orifice. The prep was excellent. The cecum, ascending, transverse,  descending and sigmoid colon all appeared normal with no masses, polyps,  diverticula or other mucosal abnormalities. The rectum likewise appeared  normal and retroflexed view of the anus revealed no obvious internal  hemorrhoids. The scope was then withdrawn and the patient returned to the  recovery room in stable condition. She tolerated the procedure well and  there were no immediate complications.   IMPRESSION:  Normal colonoscopy.   PLAN:  Repeat study in 5 years.           ______________________________  Everardo All Madilyn Fireman, M.D.     JCH/MEDQ  D:  07/13/2005  T:  07/13/2005  Job:  [id number]   cc:   Duncan Dull, M.D.  [address]  [address]  [place]  [place] [address]  Fax: [phone]

## 2011-04-16 NOTE — H&P (Signed)
NAME:  FLOYCE, BUJAK NO.:  [id number]   MEDICAL RECORD NO.:  [id number]          PATIENT TYPE:  INP   LOCATION:  1828                         FACILITY:  [facility]   PHYSICIAN:  Titus Dubin. Alwyn Ren, M.D. [facility] OF BIRTH:  October 13, 1946   DATE OF ADMISSION:  02/25/2006  DATE OF DISCHARGE:                                HISTORY & PHYSICAL   Kelly Rocha is a 65 year old female admitted with atypical chest pain.   She noted pinching chest pain the evening of March 29, which was  intermittent throughout the evening and night. It was over the lower sternal  area to the left. There was no other radiation; she had no nausea but did  have sweating. The pain had resolved by 5 a.m.   Additionally she had flushing between 11 p.m. and 5 a.m. which disturbed her  sleep. She also noted numbness of her hands which responded only partially  to shaking them.   Additionally since 11 p.m. she had a severe headache from the frontal area  with radiation into the occiput. She also noted a pinching sensation over  the right occiput.   She was checking her blood pressure and found it to be 146/79, prompting her  to come to the office. She had called and described the chest pain; she was  told to go to the emergency room should she have recurrence or progression  of the chest pain.   She did take two 81 mg aspirin approximately an hour before the visit. In  the office EKG was found to be normal. Although she was having no chest pain  she was to be admitted for evaluation of her symptoms.  While in the office she had onset of chest pain which prompted repeating an  EKG. The second EKG again was normal. She was given nitroglycerin and  arrangements were made for an emergent admission.   PAST MEDICAL HISTORY:  1.  Reveals three pregnancies and three deliveries. She had two C-sections.  2.  She has also had a cyst removed from the chest wall.  3.  She had a  polypectomy at colonoscopy.  4.   She was diagnosed with osteoporosis.   FAMILY HISTORY:  Positive family history for melanoma and stroke in her  maternal grandmother. Her mother had stomach cancer in 26. She had  subsequent intra-abdominal procedure in 2007. Her mother also has  dyslipidemia. Father had myocardial infarction, hypertension and  cardiomegaly. He died of a heart attack at 73. He also had dyslipidemia. Her  son has hypertension and dyslipidemia.   She has never smoked. She is on no specific diet. She typically will walk  several blocks 2-3 x a week but had been inactive x2 months.   She was originally seen February 21, 2006 concerned about her prior cholesterol  of 277. Two weeks prior to the visit she noted dizziness, shortness of  breath and cold sweats and heart pounding which lasted 3 hours. This was  associated with some chest pain. She was frightened by this episode and had  scheduled an appointment.  That had to be deferred because of her mother's  illness and surgery. Since that episode she had some dizziness upon  awakening each morning and palpitations. With the original episode 2 weeks  prior to the March 26 visit, she denied any diarrhea or flushing. She had  some blurring of her vision for a month. She described intermittent vertigo  for years. She had been intolerant to Antivert. The vertigo was evaluated in  1998 and CT was negative.   REVIEW OF SYSTEMS:  Is detailed above.   PHYSICAL EXAMINATION:  VITAL SIGNS:  Blood pressure was 180/100 initially,  pulse was 110. When seen February 21, 2006 her blood pressure had been 144/100  with a repeat blood pressure of 140/96.  HEENT:  Fundal exam revealed slight arterial narrowing. She had minimal  nystagmus.  Otolaryngologic exam was unremarkable. Dental hygiene is  excellent.  CHEST:  Clear to auscultation.  HEART:  She had a questionable click with a grade 1 half systolic murmur.  ABDOMEN:  Nontender with no organomegaly or masses.  EXTREMITIES:   All pulses were intact and she had no edema. Homan's sign was  negative.   When originally seen on February 21, 2006 she had a slight Romberg. She was  slightly tremulous as if chilling.   EKG on February 21, 2006 was normal. Labs collected on February 23, 2006 revealed  normal CBC and differential with exception of a white count of 4.4 thousand,  and normal C-MET except for an alkaline phosphatase of 30. Free T4 and TSH  were normal.  Pending is NMR lipo science profile.   Originally she was scheduled for nuclear stress-test on April 11. Because of  the acute episode of February 24, 2006 and chest pain in the office, she was  admitted for cardiac enzymes. A 24 hour urine for catecholamines and  metanephrine's will be collected. She will be set up for the stress-test if  the work up is negative.      Titus Dubin. Alwyn Ren, M.D. [facility]  Electronically Signed     [facility]/MEDQ  D:  02/25/2006  T:  02/26/2006  Job:  [id number]

## 2011-04-16 NOTE — Discharge Summary (Signed)
NAME:  Kelly Rocha, Kelly Rocha                  ACCOUNT NO.:  [id number]   MEDICAL RECORD NO.:  [id number]          PATIENT TYPE:  INP   LOCATION:  3739                         FACILITY:  [facility]   PHYSICIAN:  Sean A. Everardo All, M.D. [facility] OF BIRTH:  Dec 16, 1945   DATE OF ADMISSION:  02/25/2006  DATE OF DISCHARGE:  02/27/2006                                 DISCHARGE SUMMARY   REASON FOR ADMISSION:  Chest pain.   HISTORY OF PRESENT ILLNESS:  This is a 65 year old woman, admitted by Dr.  Alwyn Ren on February 25, 2006.  Please refer to his dictated History and Physical  for details.   HOSPITAL COURSE:  She was admitted, and she had no chest pain during her  admission.  Cardiac enzymes were negative.  A 24-hour urine catecholamine  was done, and the results are p ending at the time of discharge.  She was  discharged home in good condition February 27, 2006.   DISCHARGE DIAGNOSIS:  Chest pain of uncertain etiology.   MEDICATIONS:  None.   FOLLOW UP:  1.  I will request to move up the date of her cardiac nuclear study.  2.  Please see Dr. Alwyn Ren the week of February 9 to follow up the results of      the cardiac nuclear study as well as the urine.  3.  Return to hospital for recurrent chest pain.           ______________________________  Cleophas Dunker. Everardo All, M.D. [facility]     SAE/MEDQ  D:  02/27/2006  T:  03/01/2006  Job:  [id number]   cc:   Titus Dubin. Alwyn Ren, M.D. [facility]  [phone] [place]  [place] [address]

## 2011-04-21 ENCOUNTER — Other Ambulatory Visit: Payer: Self-pay

## 2011-04-21 MED ORDER — DIAZEPAM 5 MG PO TABS: 5.0000 mg | ORAL_TABLET | Freq: Three times a day (TID) | ORAL | Status: DC | PRN

## 2011-05-04 ENCOUNTER — Other Ambulatory Visit: Payer: Self-pay

## 2011-05-04 MED ORDER — ESOMEPRAZOLE MAGNESIUM 40 MG PO CPDR: 40.0000 mg | DELAYED_RELEASE_CAPSULE | Freq: Every day | ORAL | Status: DC

## 2011-05-04 NOTE — Telephone Encounter (Signed)
Pt was given samples of nexium says medication is working well and will be filling out information to get medication through pt assistance.

## 2011-06-11 ENCOUNTER — Other Ambulatory Visit: Payer: Self-pay | Admitting: Internal Medicine

## 2011-06-29 ENCOUNTER — Other Ambulatory Visit: Payer: Self-pay | Admitting: Internal Medicine

## 2011-06-29 NOTE — Telephone Encounter (Signed)
Left msg on voicemail # provided was incorrect informed pt to call w/ updated # and street location.

## 2011-06-29 NOTE — Telephone Encounter (Signed)
OK X1 

## 2011-06-29 NOTE — Telephone Encounter (Signed)
Pt called would like to have rx for phenergan is in [place] taking care of mother and says she would like to have medication called into [facility] [phone]. Ok to refill?

## 2011-06-30 MED ORDER — PROMETHAZINE HCL 25 MG RE SUPP: 25.0000 mg | Freq: Four times a day (QID) | RECTAL | Status: DC | PRN

## 2011-06-30 NOTE — Telephone Encounter (Signed)
Spoke w/ pt meds called in.

## 2011-07-06 ENCOUNTER — Other Ambulatory Visit: Payer: Self-pay | Admitting: Obstetrics and Gynecology

## 2011-07-06 DIAGNOSIS — Z1231 Encounter for screening mammogram for malignant neoplasm of breast: Secondary | ICD-10-CM

## 2011-07-07 ENCOUNTER — Other Ambulatory Visit: Payer: Self-pay | Admitting: Internal Medicine

## 2011-07-07 MED ORDER — PRAVASTATIN SODIUM 20 MG PO TABS: 20.0000 mg | ORAL_TABLET | Freq: Every day | ORAL | Status: DC

## 2011-07-07 NOTE — Telephone Encounter (Signed)
Pt called would like to go ahead with recommendation to start cholesterol medication informed that we will send medication to pharmacy and that she is to have labs done  rechecking fasting lipids and hepatic panel after 10 weeks. (272.4, 995.2)

## 2011-07-15 ENCOUNTER — Other Ambulatory Visit: Payer: Self-pay | Admitting: Internal Medicine

## 2011-07-22 ENCOUNTER — Ambulatory Visit: Payer: Self-pay

## 2011-08-03 ENCOUNTER — Ambulatory Visit: Payer: Self-pay

## 2011-08-04 ENCOUNTER — Other Ambulatory Visit: Payer: Self-pay | Admitting: *Deleted

## 2011-08-04 NOTE — Telephone Encounter (Signed)
Med rx'ed on 06/29/2011 #6, Dr.Hopper please advise if ok to prescribed continually as needed for patient

## 2011-08-04 NOTE — Telephone Encounter (Signed)
This medication should not be needed regularly; I recommend GI consult if she is having recurrent nausea.  We need to determine the cause rather than treating  Symptoms only. Does she have preference for GI ?

## 2011-08-05 ENCOUNTER — Telehealth: Payer: Self-pay

## 2011-08-05 MED ORDER — PROMETHAZINE HCL 25 MG RE SUPP: 25.0000 mg | Freq: Four times a day (QID) | RECTAL | Status: DC | PRN

## 2011-08-05 NOTE — Telephone Encounter (Signed)
Called to make pt aware and pt states she needs phenergan in order to make it to Urgent Care.

## 2011-08-05 NOTE — Telephone Encounter (Signed)
Pt states she has been experiencing nausea x 4 days and is immobile and cannot come in to see the doctor due to feeling so bad.  Pt has been trying crackers and ginger ale; pt states she is so sick she cannot lift her head.  Pt states she thinks this is a reaction to Amitriptyline or Topamax.  She has stopped both these medications.  Pls advise.

## 2011-08-05 NOTE — Telephone Encounter (Signed)
pls advise

## 2011-08-05 NOTE — Telephone Encounter (Signed)
Per Dr. Alwyn Ren rx sent in for phenergan 25 mg to pharmacy.  Pt is aware.

## 2011-08-05 NOTE — Telephone Encounter (Signed)
If she has been ill for 4 days and is not improving off these medications; she should be seen at the common urgent care. Her description that  she is so sick that she is  not able to lift her head suggests that she should  be  evaluated.

## 2011-08-05 NOTE — Telephone Encounter (Signed)
See phone note dated 08/05/11, patient will go to Urgent Care. Dr Alwyn Ren advised in other phone note phenergan ok # 6 no refills. RX faxed

## 2011-08-11 ENCOUNTER — Ambulatory Visit: Payer: Self-pay

## 2011-08-20 ENCOUNTER — Other Ambulatory Visit: Payer: Self-pay | Admitting: Gastroenterology

## 2011-08-23 ENCOUNTER — Ambulatory Visit
Admission: RE | Admit: 2011-08-23 | Discharge: 2011-08-23 | Disposition: A | Discharge status: Home / Self Care | Payer: Medicare Other | Source: Ambulatory Visit | Attending: Gastroenterology | Admitting: Gastroenterology

## 2011-08-26 ENCOUNTER — Ambulatory Visit: Payer: Self-pay

## 2011-08-31 ENCOUNTER — Other Ambulatory Visit: Payer: Self-pay | Admitting: Internal Medicine

## 2011-09-08 ENCOUNTER — Other Ambulatory Visit (HOSPITAL_COMMUNITY): Payer: Self-pay | Admitting: Gastroenterology

## 2011-09-08 DIAGNOSIS — R141 Gas pain: Secondary | ICD-10-CM

## 2011-09-08 DIAGNOSIS — R110 Nausea: Secondary | ICD-10-CM

## 2011-09-09 ENCOUNTER — Inpatient Hospital Stay: Admission: RE | Admit: 2011-09-09 | Payer: Self-pay | Source: Ambulatory Visit

## 2011-09-22 ENCOUNTER — Encounter (HOSPITAL_COMMUNITY)
Admission: RE | Admit: 2011-09-22 | Discharge: 2011-09-22 | Disposition: A | Discharge status: Home / Self Care | Payer: Medicare Other | Source: Ambulatory Visit | Attending: Gastroenterology | Admitting: Gastroenterology

## 2011-09-22 ENCOUNTER — Encounter (HOSPITAL_COMMUNITY): Payer: Self-pay

## 2011-09-22 DIAGNOSIS — R109 Unspecified abdominal pain: Secondary | ICD-10-CM | POA: Insufficient documentation

## 2011-09-22 DIAGNOSIS — R141 Gas pain: Secondary | ICD-10-CM

## 2011-09-22 DIAGNOSIS — R1013 Epigastric pain: Secondary | ICD-10-CM | POA: Insufficient documentation

## 2011-09-22 DIAGNOSIS — K3189 Other diseases of stomach and duodenum: Secondary | ICD-10-CM | POA: Insufficient documentation

## 2011-09-22 DIAGNOSIS — R110 Nausea: Secondary | ICD-10-CM | POA: Insufficient documentation

## 2011-09-22 MED ORDER — TECHNETIUM TC 99M SULFUR COLLOID: 2.0000 | Freq: Once | INTRAVENOUS | Status: DC | PRN

## 2011-09-22 MED ORDER — TECHNETIUM TC 99M SULFUR COLLOID
2.0000 | Freq: Once | INTRAVENOUS | Status: AC | PRN
  Administered 2011-09-22: 2 via INTRAVENOUS

## 2011-10-07 ENCOUNTER — Ambulatory Visit
Admission: RE | Admit: 2011-10-07 | Discharge: 2011-10-07 | Disposition: A | Discharge status: Home / Self Care | Payer: Medicare Other | Source: Ambulatory Visit | Attending: Obstetrics and Gynecology | Admitting: Obstetrics and Gynecology

## 2011-10-07 DIAGNOSIS — Z1231 Encounter for screening mammogram for malignant neoplasm of breast: Secondary | ICD-10-CM

## 2011-10-26 ENCOUNTER — Encounter: Payer: Self-pay | Admitting: Internal Medicine

## 2011-11-29 ENCOUNTER — Telehealth: Payer: Self-pay

## 2011-11-29 NOTE — Telephone Encounter (Signed)
Call from patient and she stated she was in [place] with family and she is sick, currently running temps of 100.7 with a cough, congestion and she wanted to know if we could call in Tamiflu or Abx. I advised patient that we can not call in Abx, her symptoms will need to be assessed by a physician and they will need to treat accordingly. I advised since she is out of town to try a nearby urgent care and she voiced understanding.    KP

## 2012-04-21 ENCOUNTER — Telehealth: Payer: Self-pay | Admitting: *Deleted

## 2012-04-21 NOTE — Telephone Encounter (Signed)
Pt left VM that she was given Rx for pravastatin back in Aug but never started it because she was sick. Pt would like to know if she should go ahead and start med or if she should have labs recheck. .Please advise    Left message to call office

## 2012-04-22 NOTE — Telephone Encounter (Signed)
Risk of premature heart attack or stroke increases as LDL or BAD cholesterol rises. Based on your prior advanced cholesterol testing, your LDL goal is < 150 , ideally < 120. The best dietary  information on cholesterol is Dr Gildardo Griffes book Eat, Drink & Be Healthy. Come in for a fasting cholesterol panel after 3-4 months of nutritional changes based on that reference. PLEASE BRING THESE INSTRUCTIONS TO FOLLOW UP  LAB APPOINTMENT.This will guarantee correct labs are drawn, eliminating need for repeat blood sampling ( needle sticks ! ). Diagnoses /Codes: 272.4,V17.3

## 2012-04-25 NOTE — Telephone Encounter (Signed)
So do you want her to start med and have labs in 3 to 4 month or continue with lifestyle changes and check labs.Please advise

## 2012-04-26 NOTE — Telephone Encounter (Signed)
Left message to call office

## 2012-04-26 NOTE — Telephone Encounter (Signed)
Follow diet w/o meds & check lipids 3-4 mos

## 2012-05-05 NOTE — Telephone Encounter (Signed)
Pt advise of Dr Alwyn Ren comments per Selena Batten.

## 2012-05-20 ENCOUNTER — Emergency Department (HOSPITAL_COMMUNITY): Payer: Medicare Other

## 2012-05-20 ENCOUNTER — Emergency Department (HOSPITAL_COMMUNITY)
Admission: EM | Admit: 2012-05-20 | Discharge: 2012-05-20 | Disposition: A | Discharge status: Home / Self Care | Payer: Medicare Other | Attending: Emergency Medicine | Admitting: Emergency Medicine

## 2012-05-20 ENCOUNTER — Encounter (HOSPITAL_COMMUNITY): Payer: Self-pay

## 2012-05-20 DIAGNOSIS — M7500 Adhesive capsulitis of unspecified shoulder: Secondary | ICD-10-CM

## 2012-05-20 DIAGNOSIS — R110 Nausea: Secondary | ICD-10-CM | POA: Insufficient documentation

## 2012-05-20 DIAGNOSIS — Z79899 Other long term (current) drug therapy: Secondary | ICD-10-CM | POA: Insufficient documentation

## 2012-05-20 DIAGNOSIS — G43909 Migraine, unspecified, not intractable, without status migrainosus: Secondary | ICD-10-CM | POA: Insufficient documentation

## 2012-05-20 DIAGNOSIS — R209 Unspecified disturbances of skin sensation: Secondary | ICD-10-CM | POA: Insufficient documentation

## 2012-05-20 DIAGNOSIS — R112 Nausea with vomiting, unspecified: Secondary | ICD-10-CM | POA: Insufficient documentation

## 2012-05-20 HISTORY — DX: Adhesive capsulitis of unspecified shoulder: M75.00

## 2012-05-20 HISTORY — DX: Migraine, unspecified, not intractable, without status migrainosus: G43.909

## 2012-05-20 HISTORY — DX: Dizziness and giddiness: R42

## 2012-05-20 LAB — BASIC METABOLIC PANEL
BUN: 11 mg/dL (ref 6–23)
Chloride: 105 mEq/L (ref 96–112)
GFR calc Af Amer: >90 mL/min (ref >90)
GFR calc non Af Amer: 86 mL/min — ABNORMAL LOW (ref >90)
Glucose, Bld: 126 mg/dL — ABNORMAL HIGH (ref 70–99)
Potassium: 3.7 mEq/L (ref 3.5–5.1)
Sodium: 139 mEq/L (ref 135–145)

## 2012-05-20 LAB — DIFFERENTIAL
Lymphs Abs: 1.2 10*3/uL (ref 0.7–4.0)
Monocytes Absolute: 0.7 10*3/uL (ref 0.1–1.0)
Monocytes Relative: 7 % (ref 3–12)
Neutro Abs: 7.4 10*3/uL (ref 1.7–7.7)
Neutrophils Relative %: 80 % — ABNORMAL HIGH (ref 43–77)

## 2012-05-20 LAB — CBC
HCT: 38.2 % (ref 36.0–46.0)
Hemoglobin: 13.1 g/dL (ref 12.0–15.0)
MCHC: 34.3 g/dL (ref 30.0–36.0)
RBC: 4.39 MIL/uL (ref 3.87–5.11)

## 2012-05-20 MED ORDER — DIPHENHYDRAMINE HCL 50 MG/ML IJ SOLN
25.0000 mg | Freq: Once | INTRAMUSCULAR | Status: AC
  Administered 2012-05-20: 25 mg via INTRAVENOUS
  Filled 2012-05-20: qty 1

## 2012-05-20 MED ORDER — DEXAMETHASONE SODIUM PHOSPHATE 10 MG/ML IJ SOLN
10.0000 mg | Freq: Once | INTRAMUSCULAR | Status: AC
  Administered 2012-05-20: 10 mg via INTRAVENOUS
  Filled 2012-05-20: qty 1

## 2012-05-20 MED ORDER — SODIUM CHLORIDE 0.9 % IV BOLUS (SEPSIS)
500.0000 mL | Freq: Once | INTRAVENOUS | Status: AC
  Administered 2012-05-20: 500 mL via INTRAVENOUS

## 2012-05-20 MED ORDER — FENTANYL CITRATE 0.05 MG/ML IJ SOLN
50.0000 ug | Freq: Once | INTRAMUSCULAR | Status: AC
  Administered 2012-05-20: 50 ug via INTRAVENOUS
  Filled 2012-05-20: qty 2

## 2012-05-20 MED ORDER — FENTANYL 12 MCG/HR TD PT72
12.5000 ug | MEDICATED_PATCH | TRANSDERMAL | Status: DC
  Administered 2012-05-20: 12.5 ug via TRANSDERMAL
  Filled 2012-05-20: qty 1

## 2012-05-20 MED ORDER — ONDANSETRON HCL 4 MG/2ML IJ SOLN
4.0000 mg | Freq: Once | INTRAMUSCULAR | Status: AC
Start: 2012-05-20 — End: 2012-05-20
  Administered 2012-05-20: 4 mg via INTRAVENOUS
  Filled 2012-05-20: qty 2

## 2012-05-20 MED ORDER — METOCLOPRAMIDE HCL 5 MG/ML IJ SOLN
10.0000 mg | Freq: Once | INTRAMUSCULAR | Status: AC
  Administered 2012-05-20: 10 mg via INTRAVENOUS
  Filled 2012-05-20: qty 2

## 2012-05-20 MED ORDER — PROMETHAZINE HCL 25 MG RE SUPP: 25.0000 mg | Freq: Four times a day (QID) | RECTAL | Status: AC | PRN

## 2012-05-20 MED ORDER — FENTANYL CITRATE 0.05 MG/ML IJ SOLN: 25.0000 ug | Freq: Once | INTRAMUSCULAR | Status: DC

## 2012-05-20 MED ORDER — KETOROLAC TROMETHAMINE 30 MG/ML IJ SOLN
30.0000 mg | Freq: Once | INTRAMUSCULAR | Status: AC
  Administered 2012-05-20: 30 mg via INTRAVENOUS
  Filled 2012-05-20: qty 1

## 2012-05-20 MED ORDER — TRAMADOL HCL 50 MG PO TABS
50.0000 mg | ORAL_TABLET | Freq: Once | ORAL | Status: AC
  Administered 2012-05-20: 50 mg via ORAL
  Filled 2012-05-20: qty 1

## 2012-05-20 NOTE — ED Notes (Signed)
I gave the patient a pair of tan large socks. 

## 2012-05-20 NOTE — ED Provider Notes (Signed)
History     CSN: [id number]  Arrival date & time 05/20/12  0906   First MD Initiated Contact with Patient 05/20/12 [phone]      Chief Complaint  Patient presents with  . Headache  . Nausea  . Tingling    (Consider location/radiation/quality/duration/timing/severity/associated sxs/prior treatment) HPI  Past Medical History  Diagnosis Date  . Migraines   . Vertigo   . Frozen shoulder     left     Past Surgical History  Procedure Date  . Cesarean section     Family History  Problem Relation Age of Onset  . Heart disease Father     Cardiomyopathy   . Heart attack Father   . Stomach cancer Mother   . Hyperlipidemia Mother   . Hiatal hernia Sister   . GER disease Sister   . Osteoarthritis Sister   . Hypertension Son   . Hyperlipidemia Son   . Melanoma Maternal Grandmother   . Multiple sclerosis Daughter     History  Substance Use Topics  . Smoking status: Never Smoker   . Smokeless tobacco: Not on file  . Alcohol Use: No    OB History    Grav Para Term Preterm Abortions TAB SAB Ect Mult Living                  Review of Systems  Allergies  Review of patient's allergies indicates no known allergies.  Home Medications   Current Outpatient Rx  Name Route Sig Dispense Refill  . OMEPRAZOLE MAGNESIUM 20 MG PO TBEC Oral Take 20 mg by mouth daily.    Marland Kitchen PROMETHAZINE HCL 25 MG RE SUPP Rectal Place 25 mg rectally every 6 (six) hours as needed. For vomiting    . TRAMADOL HCL 50 MG PO TABS Oral Take 50 mg by mouth every 6 (six) hours as needed. For shoulder pain      BP 134/65  Pulse 80  Temp 97.6 F (36.4 C) (Oral)  Resp 16  SpO2 100%  Physical Exam  ED Course  Procedures (including critical care time)  Labs Reviewed  BASIC METABOLIC PANEL - Abnormal; Notable for the following:    Glucose, Bld 126 (*)     GFR calc non Af Amer 86 (*)     All other components within normal limits  DIFFERENTIAL - Abnormal; Notable for the following:    Neutrophils  Relative 80 (*)     All other components within normal limits  CBC   Ct Head Wo Contrast  05/20/2012  *RADIOLOGY REPORT*  Clinical Data: 66 year old female with headache and nausea.  CT HEAD WITHOUT CONTRAST  Technique:  Contiguous axial images were obtained from the base of the skull through the vertex without contrast.  Comparison: None  Findings: No intracranial abnormalities are identified, including mass lesion or mass effect, hydrocephalus, extra-axial fluid collection, midline shift, hemorrhage, or acute infarction.  The visualized bony calvarium is unremarkable.  IMPRESSION: Unremarkable exam.  Original Report Authenticated By: Rosendo Gros, M.D.     1. Migraine headache   2. Frozen shoulder   3. Nausea & vomiting    2:11 PM Patient from Pod B/D. CC: HA and frozen shoulder. HA treated with zofran and fentanyl. Fentanyl patch placed. Will d/c when patient is ready to go.   Vital signs reviewed and are as follows: Filed Vitals:   05/20/12 1122  BP: 134/65  Pulse:   Temp:   Resp: 16  BP 134/65  Pulse 80  Temp 97.6 F (36.4 C) (Oral)  Resp 16  SpO2 100%  2:23 PM Patient seen and examined. Continues with R sided HA without improvement. She states her L shoulder pain is improving.   Exam:  Gen NAD; Heart RRR, nml S1,S2, no m/r/g; Lungs CTAB; Abd soft, mild diffuse tenderness patient associates with nausea, no rebound or guarding; Ext 2+ pedal pulses bilaterally, no edema; Neuro CN II-XII grossly intact, sensation normal.   4:10 PM Patient is still having pain. Will give tramadol per patient request.   5:39 PM Pain is currently 7/10. Will give reglan, benadryl, decadron. Pt on monitor. No prolonged QT. She is awake and alert. Will also give meal. She appears well.   7:21 PM HA resolved. Patient wishes to go home. Will discharge. Urged PCP follow-up, return if worsening symptoms. She verbalizes understanding and agrees with plan.   MDM  HA - neg CT, normal neuro exam,  improved with reglan/benadryl Shoulder pain - treated with fentanyl patch and NSAIDs, this is improved as well        Renne Crigler, PA 05/20/12 1922

## 2012-05-20 NOTE — ED Notes (Signed)
Report given to Toniann Fail, Charity fundraiser.  Pt being transferred to CDU 5.

## 2012-05-20 NOTE — ED Notes (Signed)
I gave the patient a cup of ice and a ginger-ale. 

## 2012-05-20 NOTE — ED Notes (Signed)
Pt now c/o heart fluttering.  Pt placed on cardiac monitor.

## 2012-05-20 NOTE — ED Provider Notes (Signed)
History     CSN: [id number]  Arrival date & time 05/20/12  0906   First MD Initiated Contact with Patient 05/20/12 [phone]      Chief Complaint  Patient presents with  . Headache  . Nausea  . Tingling     HPI Per EMS, pt woke with headache and nausea, has hx of migraines. Pt has hx of "left frozen shoulder" and c/o some tingling to the left arm. Per EMS, EKG unremarkable SR with some PVC's, BP 152/90, HR 80's, R 20, SPO2 99% RA. Pt took Tramadol at 0300 for shoulder, 324 mg ASA this AM and phenergan suppository this AM.  Past Medical History  Diagnosis Date  . Migraines   . Vertigo   . Frozen shoulder     left     Past Surgical History  Procedure Date  . Cesarean section     Family History  Problem Relation Age of Onset  . Heart disease Father     Cardiomyopathy   . Heart attack Father   . Stomach cancer Mother   . Hyperlipidemia Mother   . Hiatal hernia Sister   . GER disease Sister   . Osteoarthritis Sister   . Hypertension Son   . Hyperlipidemia Son   . Melanoma Maternal Grandmother   . Multiple sclerosis Daughter     History  Substance Use Topics  . Smoking status: Never Smoker   . Smokeless tobacco: Not on file  . Alcohol Use: No    OB History    Grav Para Term Preterm Abortions TAB SAB Ect Mult Living                  Review of Systems  All other systems reviewed and are negative.    Allergies  Review of patient's allergies indicates no known allergies.  Home Medications   Current Outpatient Rx  Name Route Sig Dispense Refill  . OMEPRAZOLE MAGNESIUM 20 MG PO TBEC Oral Take 20 mg by mouth daily.    Marland Kitchen PROMETHAZINE HCL 25 MG RE SUPP Rectal Place 25 mg rectally every 6 (six) hours as needed. For vomiting    . TRAMADOL HCL 50 MG PO TABS Oral Take 50 mg by mouth every 6 (six) hours as needed. For shoulder pain      BP 134/65  Pulse 80  Temp 97.6 F (36.4 C) (Oral)  Resp 16  SpO2 100%  Physical Exam  Nursing note and vitals  reviewed. Constitutional: She is oriented to person, place, and time. She appears well-developed and well-nourished. No distress.  HENT:  Head: Normocephalic and atraumatic.  Eyes: Pupils are equal, round, and reactive to light.  Neck: Normal range of motion.  Cardiovascular: Normal rate and intact distal pulses.         Date: 05/20/2012  Rate: 80  Rhythm: normal sinus rhythm  QRS Axis: normal  Intervals: normal  ST/T Wave abnormalities: normal  Conduction Disutrbances: none  Narrative Interpretation: unremarkable      Pulmonary/Chest: No respiratory distress.  Abdominal: Normal appearance. She exhibits no distension.  Musculoskeletal:       Left shoulder: She exhibits decreased range of motion and tenderness. She exhibits no swelling, no effusion and no crepitus.  Neurological: She is alert and oriented to person, place, and time. No cranial nerve deficit.  Skin: Skin is warm and dry. No rash noted.  Psychiatric: She has a normal mood and affect. Her behavior is normal.    ED Course  Procedures (including  critical care time) Scheduled Meds:   . fentaNYL  25 mcg Intravenous Once  . fentaNYL  50 mcg Intravenous Once  . ketorolac  30 mg Intravenous Once  . ondansetron  4 mg Intravenous Once  . sodium chloride  500 mL Intravenous Once   Continuous Infusions:  PRN Meds:.  Labs Reviewed  BASIC METABOLIC PANEL - Abnormal; Notable for the following:    Glucose, Bld 126 (*)     GFR calc non Af Amer 86 (*)     All other components within normal limits  DIFFERENTIAL - Abnormal; Notable for the following:    Neutrophils Relative 80 (*)     All other components within normal limits  CBC   Ct Head Wo Contrast  05/20/2012  *RADIOLOGY REPORT*  Clinical Data: 66 year old female with headache and nausea.  CT HEAD WITHOUT CONTRAST  Technique:  Contiguous axial images were obtained from the base of the skull through the vertex without contrast.  Comparison: None  Findings: No  intracranial abnormalities are identified, including mass lesion or mass effect, hydrocephalus, extra-axial fluid collection, midline shift, hemorrhage, or acute infarction.  The visualized bony calvarium is unremarkable.  IMPRESSION: Unremarkable exam.  Original Report Authenticated By: Rosendo Gros, M.D.     1. Migraine headache   2. Frozen shoulder   3. Nausea & vomiting       MDM  Patient feeling better but not well enough to ride in the car yet.  Plan at this time is to move to CDU.  We'll plan on placing a 25 mcg fentanyl patch for pain management over the next 2-3 days.  She can use tramadol for breakthrough pain.  She has appointment with her orthopedist next week.        Nelia Shi, MD 06/01/12 [phone]

## 2012-05-20 NOTE — ED Notes (Addendum)
Per EMS, pt woke with headache and nausea, has hx of migraines.  Pt has hx of "left frozen shoulder" and c/o some tingling to the left arm.  Per EMS, EKG unremarkable SR with some PVC's, BP  152/90, HR 80's, R 20, SPO2 99% RA.  Pt took Tramadol at 0300 for shoulder, 324 mg ASA this AM and phenergan suppository this AM.

## 2012-05-20 NOTE — Discharge Instructions (Signed)
Please read and follow all provided instructions.  Your diagnoses today include:  1. Migraine headache   2. Frozen shoulder   3. Nausea & vomiting     Tests performed today include:  CT of your head which was normal and did not show any serious cause of your headache  Blood counts and electrolytes - were normal  Vital signs. See below for your results today.   Medications:  In the Emergency Department you received:  Reglan - antinausea/headache medication  Benadryl - antihistamine to counteract potential side effects of reglan  Decadron - steroid medication thought to stop return of headache  You were also given zofran, toradol, and fentanyl.   You have been prescribed:  Phenergan - antinausea medication  Take any prescribed medications only as directed.  Additional information:  Follow any educational materials contained in this packet.  You are having a headache. No specific cause was found today for your headache. It may have been a migraine or other cause of headache. Stress, anxiety, fatigue, and depression are common triggers for headaches.  Your headache today does not appear to be life-threatening or require hospitalization, but often the exact cause of headaches is not determined in the emergency department. Therefore, follow-up with your doctor is very important to find out what may have caused your headache and whether or not you need any further diagnostic testing or treatment.   Sometimes headaches can appear benign (not harmful), but then more serious symptoms can develop which should prompt an immediate re-evaluation by your doctor or the emergency department.  BE VERY CAREFUL not to take multiple medicines containing Tylenol (also called acetaminophen). Doing so can lead to an overdose which can damage your liver and cause liver failure and possibly death.   Follow-up instructions: Please follow-up with your primary care provider in the next 3 days for  further evaluation of your symptoms. If you do not have a primary care doctor -- see below for referral information.   Return instructions:   Please return to the Emergency Department if you experience worsening symptoms.  Return if the medications do not resolve your headache, if it recurs, or if you have multiple episodes of vomiting or cannot keep down fluids.  Return if you have a change from the usual headache.  RETURN IMMEDIATELY IF you:  Develop a sudden, severe headache  Develop confusion or become poorly responsive or faint  Develop a fever above 100.63F or problem breathing  Have a change in speech, vision, swallowing, or understanding  Develop new weakness, numbness, tingling, incoordination in your arms or legs  Have a seizure  Please return if you have any other emergent concerns.  Additional Information:  Your vital signs today were: BP 123/54  Pulse 73  Temp 97.6 F (36.4 C) (Oral)  Resp 13  SpO2 96% If your blood pressure (BP) was elevated above 135/85 this visit, please have this repeated by your doctor within one month. -------------- No Primary Care Doctor Call [facility]  [phone] Other agencies that provide inexpensive medical care    [facility]  [phone]    [facility]  [phone]    [facility]  [phone]    [facility]  [phone]    [facility]  [phone]    [facility]  [phone] -------------- RESOURCE GUIDE:  Dental Problems  Patients with Medicaid: [facility]  [facility] [address]                                            [phone] [address] [employer] [address] Phone:  [phone]                                                   Phone:  [phone]  If unable to pay or uninsured, contact:  [facility] or [facility]. to become qualified for the adult dental clinic.  Chronic Pain Problems Contact [facility]   [phone] Patients need to be referred by their primary care doctor.  Insufficient Money for Medicine Contact [employer]:  call "211" or [facility] [phone].  Psychological Services [facility]  [phone] [facility]  [phone] [facility]   [phone] (emergency services [phone])  Substance Abuse Resources [facility]  [phone] [facility] [phone] [facility] [phone] Floyd[facility] Flock [phone] Residential & Outpatient Substance Abuse Program  [phone]  Abuse/Neglect [facility] Child Abuse Hotline [phone] [facility] Child Abuse Hotline [phone] (After Hours)  Emergency Shelter [facility] [phone]  M[facility] Homes [facility] [phone] [facility] [phone]  [facility] Resources  [facility]     [employer]                          [facility] Dept. [address] [place]                       [address]      [address]  Blondell Reveal Phone:  [phone]                                   Phone:  [phone]                 Phone:  [phone]  [facility] Phone:  [phone]  [facility] Child Abuse Hotline [phone] (After Hours)

## 2012-05-21 NOTE — ED Provider Notes (Signed)
Medical screening examination/treatment/procedure(s) were performed by non-physician practitioner and as supervising physician I was immediately available for consultation/collaboration.    Nelia Shi, MD 05/21/12 [phone]

## 2012-05-25 ENCOUNTER — Other Ambulatory Visit: Payer: Self-pay | Admitting: Orthopedic Surgery

## 2012-05-25 DIAGNOSIS — M25669 Stiffness of unspecified knee, not elsewhere classified: Secondary | ICD-10-CM

## 2012-05-25 DIAGNOSIS — M7500 Adhesive capsulitis of unspecified shoulder: Secondary | ICD-10-CM

## 2012-05-26 ENCOUNTER — Ambulatory Visit
Admission: RE | Admit: 2012-05-26 | Discharge: 2012-05-26 | Disposition: A | Discharge status: Home / Self Care | Payer: Medicare Other | Source: Ambulatory Visit | Attending: Orthopedic Surgery | Admitting: Orthopedic Surgery

## 2012-05-26 ENCOUNTER — Other Ambulatory Visit: Payer: Self-pay | Admitting: Orthopedic Surgery

## 2012-05-26 DIAGNOSIS — M7500 Adhesive capsulitis of unspecified shoulder: Secondary | ICD-10-CM

## 2012-05-26 DIAGNOSIS — M25669 Stiffness of unspecified knee, not elsewhere classified: Secondary | ICD-10-CM

## 2012-06-02 ENCOUNTER — Ambulatory Visit: Payer: Self-pay | Admitting: Internal Medicine

## 2013-04-05 ENCOUNTER — Telehealth: Payer: Self-pay | Admitting: Obstetrics and Gynecology

## 2013-04-05 NOTE — Telephone Encounter (Signed)
Patient requests medication that would be less expensive than Femring (cost is $300 since change in insurance).  Advised to check insurance company list of preferred medications so we know what options available.  Patient states she has no preference bte pills or patch, she is on low dose just for bone and heart health.  She will call me back with preferred med list.

## 2013-04-05 NOTE — Telephone Encounter (Signed)
Pt called wnting to speak with a nurse about changing her medication. She is on femring and would like to change to another HRT. Please call patient.

## 2013-04-06 ENCOUNTER — Telehealth: Payer: Self-pay | Admitting: Obstetrics and Gynecology

## 2013-04-09 NOTE — Telephone Encounter (Signed)
PATIENT STATES HAS NOT BEEN ABLE TO GET TO A COMPUTER TO CHECK ON LIST OF MEDICATIONS AND WILL CALL WITH THIS WHEN SHE DOES.

## 2013-04-17 NOTE — Telephone Encounter (Signed)
Duplicate msg.

## 2013-04-25 ENCOUNTER — Telehealth: Payer: Self-pay | Admitting: *Deleted

## 2013-04-25 NOTE — Telephone Encounter (Signed)
Pt may use the estradiolTtD patch 0.05mg /day once a week  and should continue the progesterone days 1-12 as she has  Been doing.

## 2013-04-25 NOTE — Telephone Encounter (Signed)
LMTCB

## 2013-04-25 NOTE — Telephone Encounter (Signed)
Patient returning Sally's call. °

## 2013-04-25 NOTE — Telephone Encounter (Signed)
Patient has e-mailed the list of list of preferred medications and questions about continuing her progesterone Q3rd month. List and paper chart to your office.

## 2013-04-26 MED ORDER — ESTRADIOL 0.05 MG/24HR TD PTWK: 1.0000 | MEDICATED_PATCH | TRANSDERMAL | Status: DC

## 2013-04-26 NOTE — Telephone Encounter (Signed)
I have attempted to contact this patient by phone with the following results: left message to return my call on answering machine (home).  

## 2013-04-26 NOTE — Telephone Encounter (Signed)
RC from pt.  Advised OK to call in RX.  She is in [place] with family and would like for it to be called to [facility] there.  Progesterone instructions given also.  Pt requests monthly supply to be sent to pharmacy.

## 2013-04-26 NOTE — Telephone Encounter (Signed)
Pt returned my call at 1:45pm.  RC to [phone].  Message left to return call at her convenience.

## 2013-04-30 ENCOUNTER — Telehealth: Payer: Self-pay | Admitting: *Deleted

## 2013-04-30 NOTE — Telephone Encounter (Signed)
Needs prior authorization for rx

## 2013-05-14 NOTE — Telephone Encounter (Signed)
Chart in your office for prior authorization. Estridiol patch or alternative medication. Please advise/ sue

## 2013-05-14 NOTE — Telephone Encounter (Signed)
Patient states she sent Kennon Rounds a list of medications her pharmacy will cover. Please advise. Per Kennon Rounds, route call to triage.

## 2013-05-15 NOTE — Telephone Encounter (Signed)
i reviewed the prior auth form.  i do not think anything will get approved until we try the premarin cream as it is on her plan.  This would be 1 gram vaginally three times weekly.  It is transvaginal estrogen like the femring but it is a cream and messier.  If she tries it for a month and dosnt like it, we can then do the prior auth for the femring or a patch.  can you call her and see what she thinks of that option?

## 2013-05-15 NOTE — Telephone Encounter (Signed)
See chart on your desk for PA for Estradiol patch. Patient called in today and has purchased 4 Fem Ring @ $230.00 out of pocket to stay on this but is on medicare and can not afford to keep doing. Was told Estradiol Patch would be less cost. Patient states this was reccommended to be the closest to the [facility] Ring and would not jolt her body to this change. She has been on Fem Ring for 8 years and has kept her healthy and has helped her osteoporosis also since her mother has severe osteoporosis. Has helped also for her menopausal symptoms of hot flash. Please advise. Form on your side desk. sue

## 2013-05-16 NOTE — Telephone Encounter (Signed)
Patient notified of Dr. Hyacinth Meeker response of prior authorization for Estradiol  Patch. Patient concerns are that premarin cream is 1 garm and the fem-ring is 0.05mg  x 40month and then she does progesterone x 12 days. This is the cycle she is used to . Would she still do progesterone if she does premarin cream.?

## 2013-05-16 NOTE — Telephone Encounter (Signed)
LMTCB  aa 

## 2013-05-16 NOTE — Telephone Encounter (Signed)
She would continue progesterone as she is currently doing.  The dosage is high with premarin cream because she is not doing it daily.  Does that help?

## 2013-05-17 NOTE — Telephone Encounter (Signed)
Spoke with pt about SM advice. Pt agreeable to try it. Pt currently in [place] taking care of family in a nursing home and will be there through August. Pt gave pharmacy info for [facility] on [address] and [address]. [phone].

## 2013-05-18 ENCOUNTER — Other Ambulatory Visit: Payer: Self-pay | Admitting: Orthopedic Surgery

## 2013-05-18 NOTE — Telephone Encounter (Signed)
Call to [facility] in [place] where pt is visiting family. Pharmacy requested by pt unable to be located in [facility] system. LM on pharmacy VM for Rx for generic premarin vaginal cream 1 gm PV 3 times weekly. Disp one 42.5 gm tube with no refills. All prescriber info given. Pt aware Rx called in.

## 2013-05-20 NOTE — Telephone Encounter (Signed)
Need chart so i can order progesterone appropriately.

## 2013-05-22 ENCOUNTER — Other Ambulatory Visit: Payer: Self-pay | Admitting: Orthopedic Surgery

## 2013-05-22 MED ORDER — ESTROGENS, CONJUGATED 0.625 MG/GM VA CREA: TOPICAL_CREAM | VAGINAL | Status: AC

## 2013-05-22 MED ORDER — PROGESTERONE MICRONIZED 200 MG PO CAPS: 200.0000 mg | ORAL_CAPSULE | Freq: Every day | ORAL | Status: DC

## 2013-05-22 NOTE — Telephone Encounter (Signed)
Orders to pharmacy are done.

## 2013-07-11 ENCOUNTER — Other Ambulatory Visit: Payer: Self-pay | Admitting: Obstetrics and Gynecology

## 2013-07-13 NOTE — Telephone Encounter (Signed)
Please tell the pt she should now use OTC Vit D, 2000 units per day.

## 2013-07-13 NOTE — Telephone Encounter (Signed)
eScribe request for refill on VITAMIN D  Last filled - 06/17/12 X 1 YEAR Last AEX - 06/27/12 Next AEX - not scheduled. Last Vitamin D - 06/28/12 = 32 Please advise refills.  Paper chart on your desk.

## 2013-07-13 NOTE — Telephone Encounter (Signed)
Pt notified and is agreeable. 

## 2013-10-31 IMAGING — CT CT HEAD W/O CM
1 series · 16 of 30 positions shown, 20 images · non-contrast
Comparison: None

CLINICAL DATA: 65-year-old female with headache and nausea.

CT HEAD WITHOUT CONTRAST
TECHNIQUE: Contiguous axial images were obtained from the base of
the skull through the vertex without contrast.

[Series 2: head routine 4.8 h37s · axial · 0.43mm/px · z∈[-151,-23]mm · 16 of 30 slices shown, 20 images]
[im 2/30  brain]
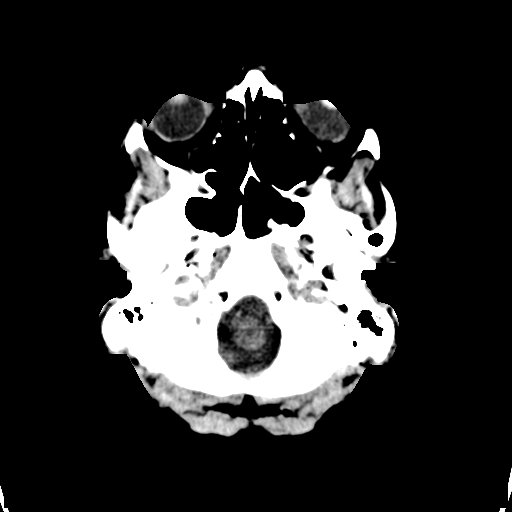
[im 2/30  bone]
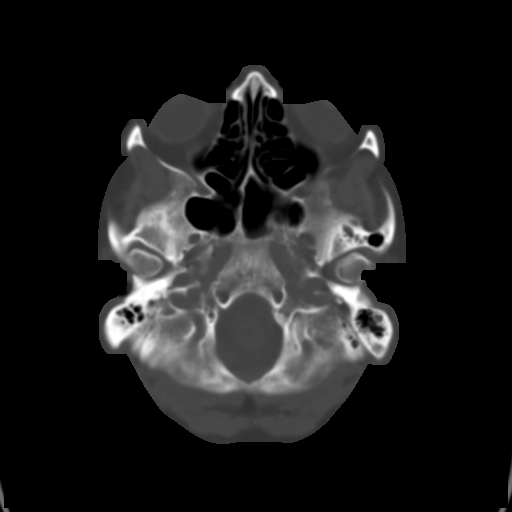
[im 4/30  brain]
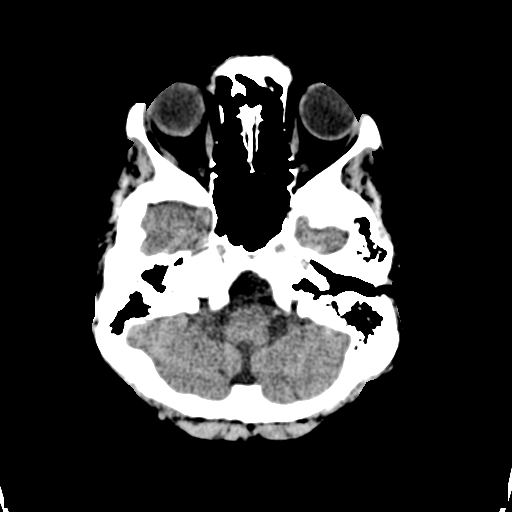
[im 6/30  brain]
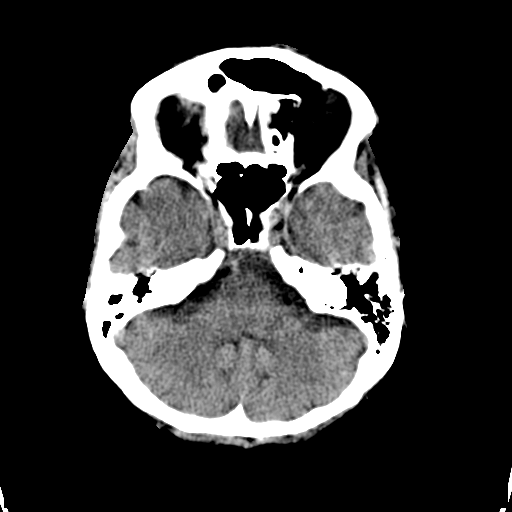
[im 8/30  brain]
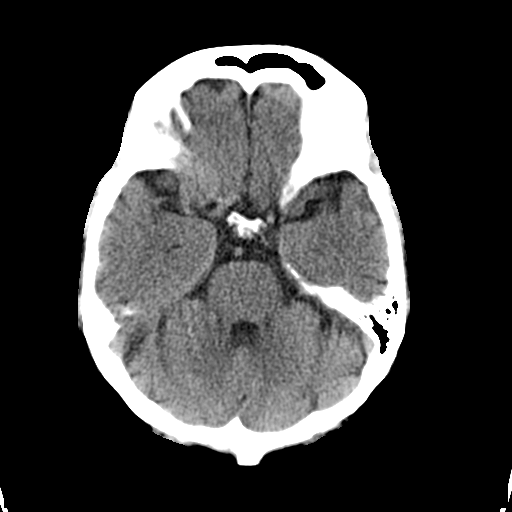
[im 9/30  brain]
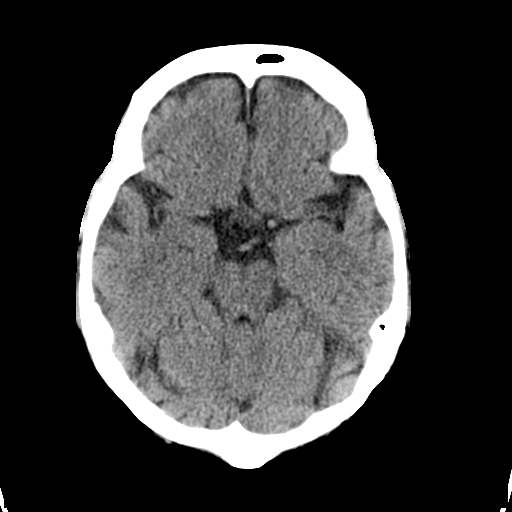
[im 9/30  bone]
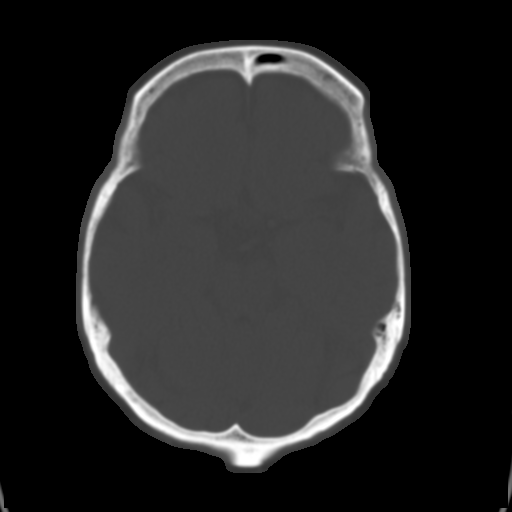
[im 11/30  brain]
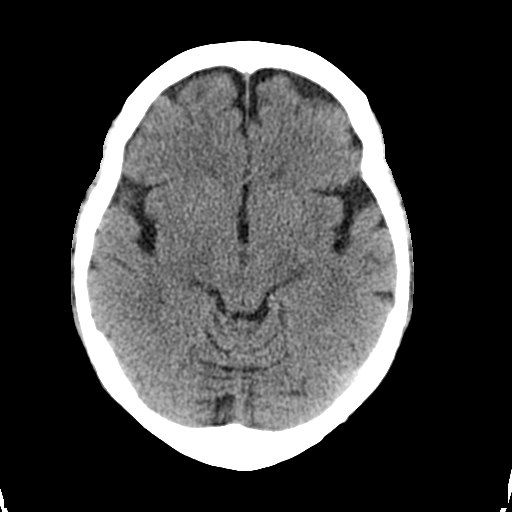
[im 13/30  brain]
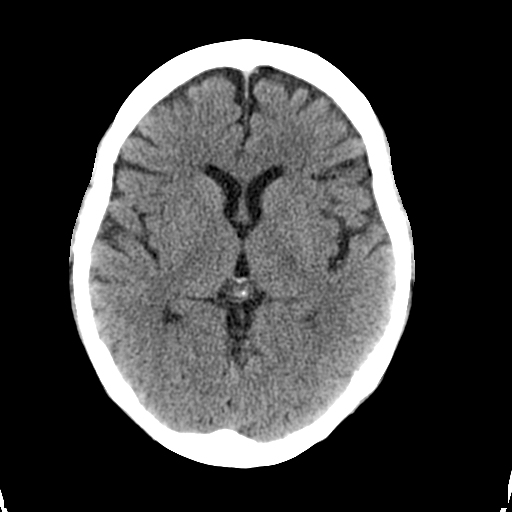
[im 15/30  brain]
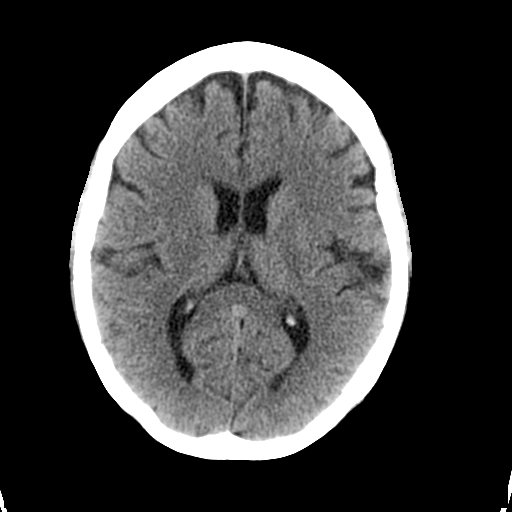
[im 16/30  brain]
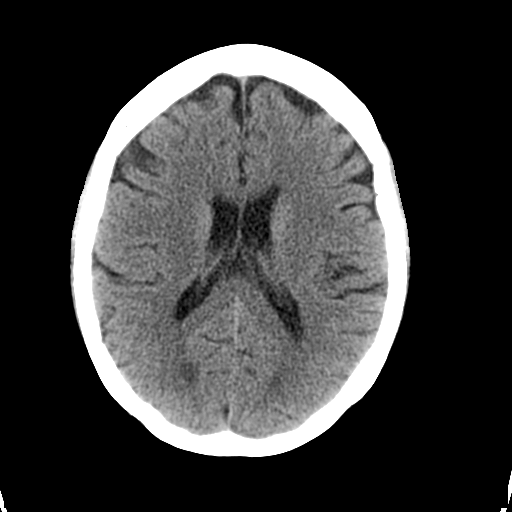
[im 16/30  bone]
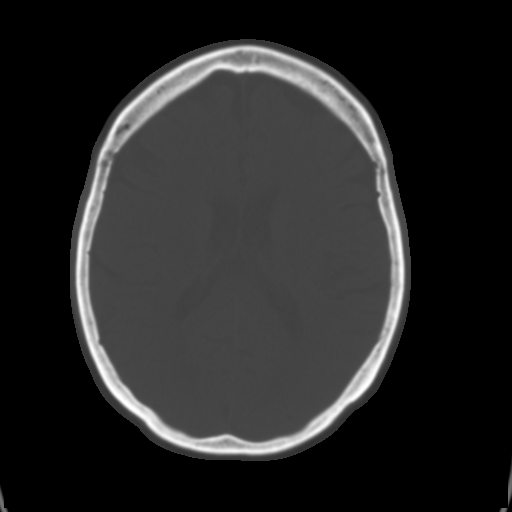
[im 18/30  brain]
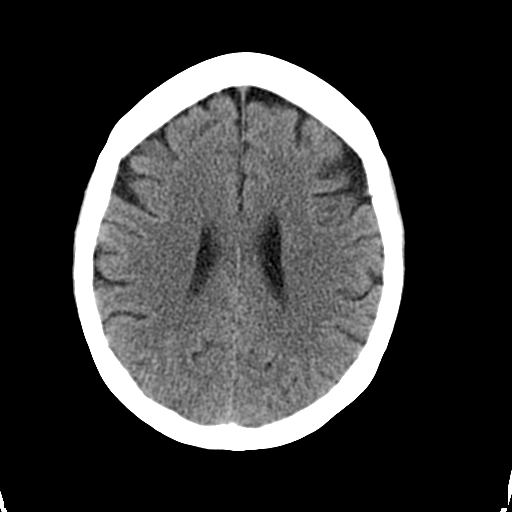
[im 20/30  brain]
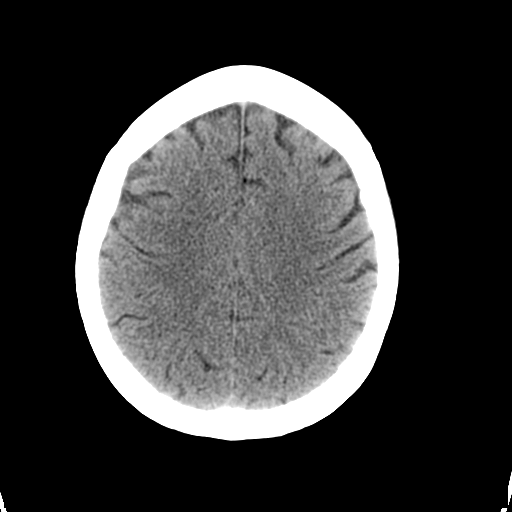
[im 22/30  brain]
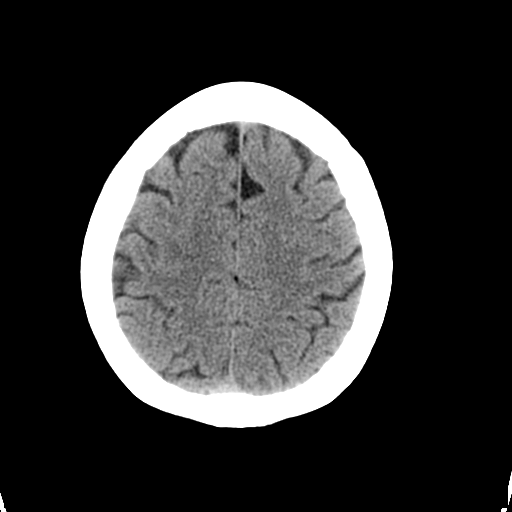
[im 23/30  brain]
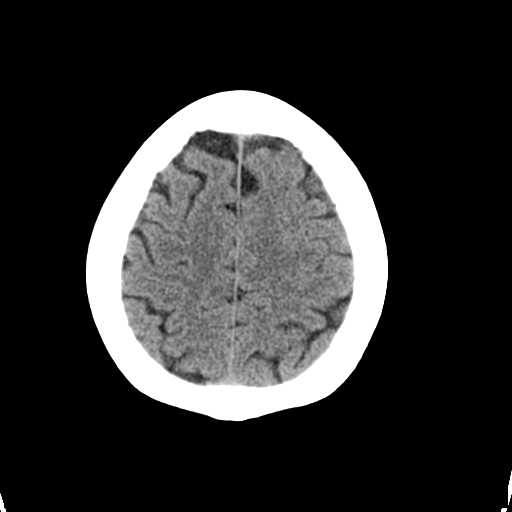
[im 23/30  bone]
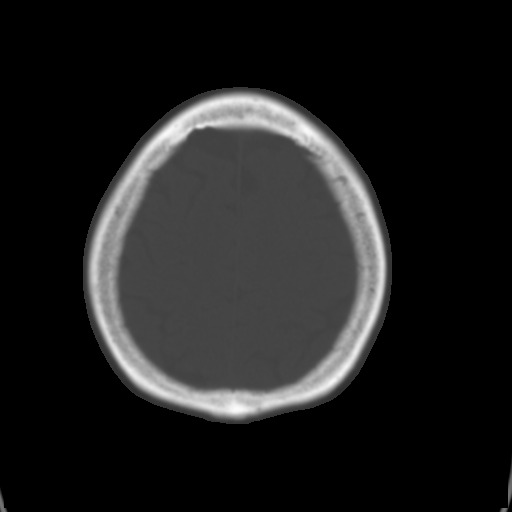
[im 25/30  brain]
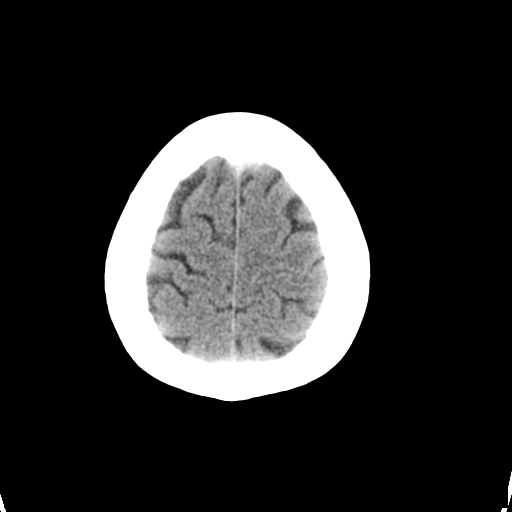
[im 27/30  brain]
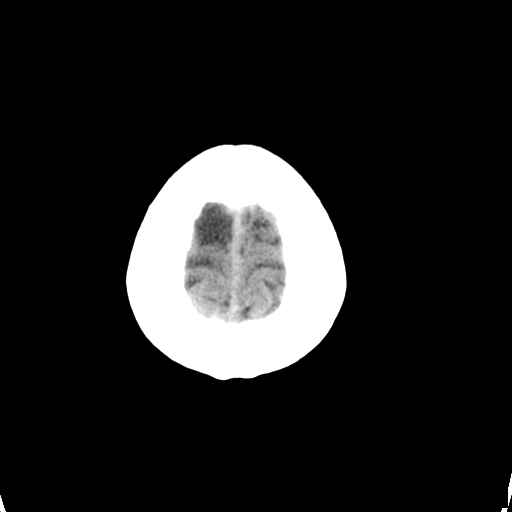
[im 29/30  brain]
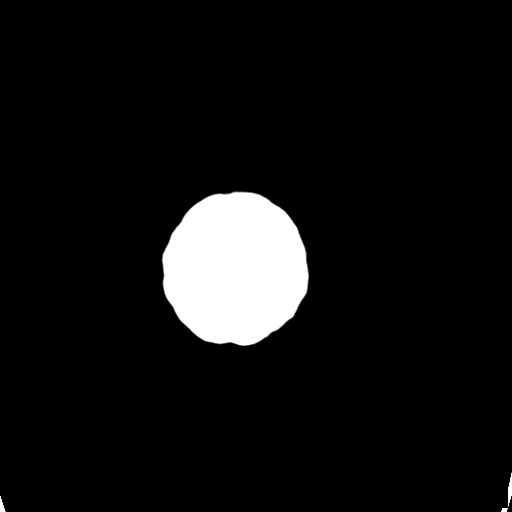

[16 of 30 positions shown; findings below may reference images not displayed]

FINDINGS: No intracranial abnormalities are identified, including
mass lesion or mass effect, hydrocephalus, extra-axial fluid
collection, midline shift, hemorrhage, or acute infarction.

The visualized bony calvarium is unremarkable.
IMPRESSION: Unremarkable exam.

## 2013-11-06 IMAGING — CT CT CHEST LIMITED W/O CM
1 of 2 series · 15 of 28 positions shown, 19 images · non-contrast
Comparison: None

CLINICAL DATA: Left supraclavicular swelling.  Frozen shoulder.

CHEST CT LIMITED WITHOUT CONTRAST
TECHNIQUE: Multidetector CT imaging of the UPPER chest was
performed following the standard protocol without intravenous
contrast.

[Series 400: sag · sagittal · 0.70mm/px · 15 of 142 slices shown, 19 images]
[im 8/142  mediastinal]
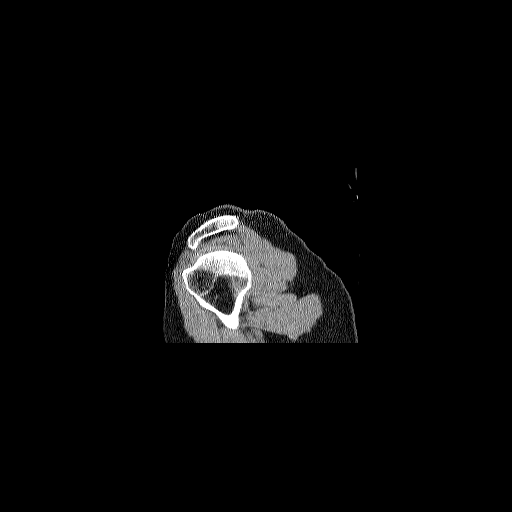
[im 8/142  lung]
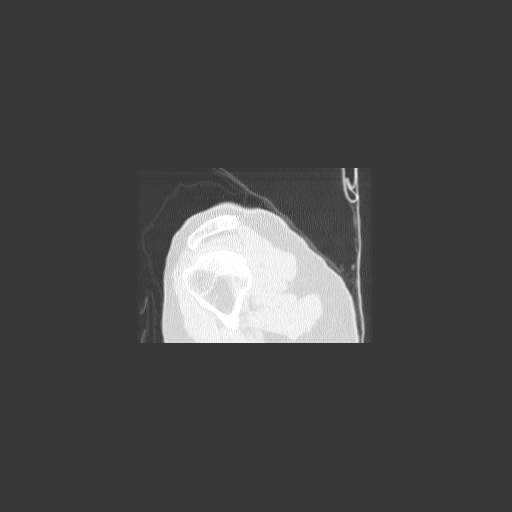
[im 16/142  lung]
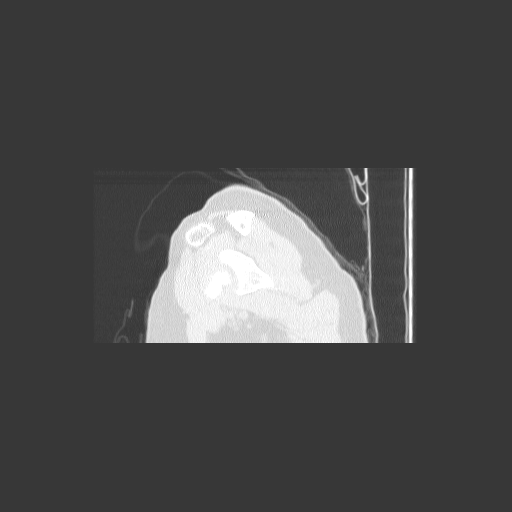
[im 24/142  lung]
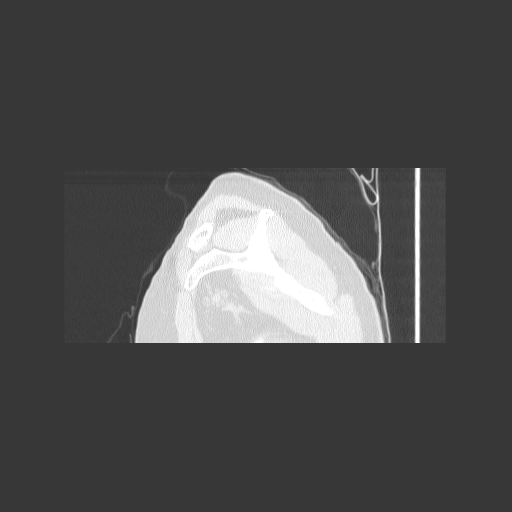
[im 32/142  lung]
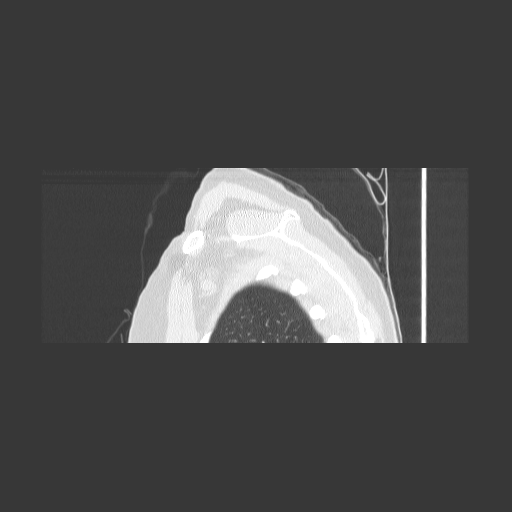
[im 48/142  mediastinal]
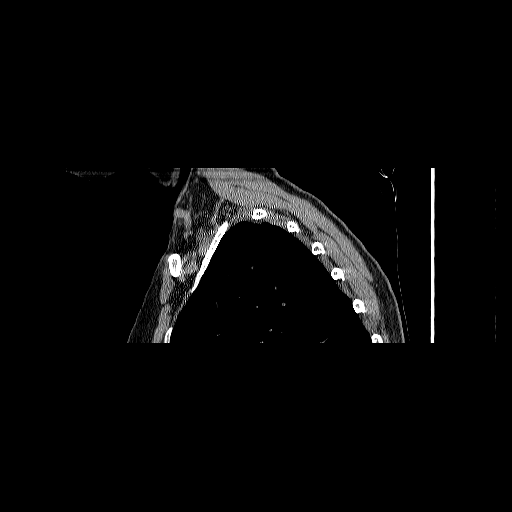
[im 48/142  lung]
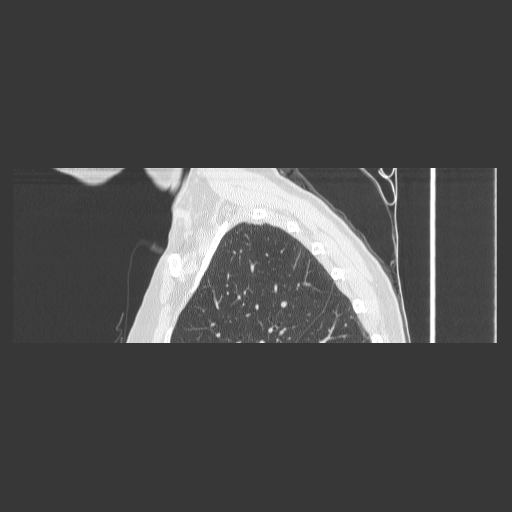
[im 55/142  lung]
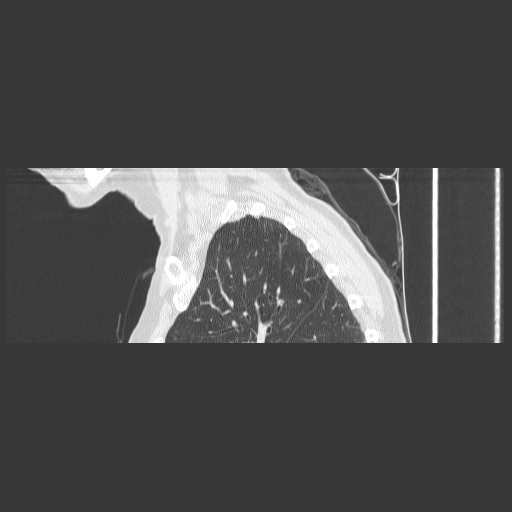
[im 63/142  lung]
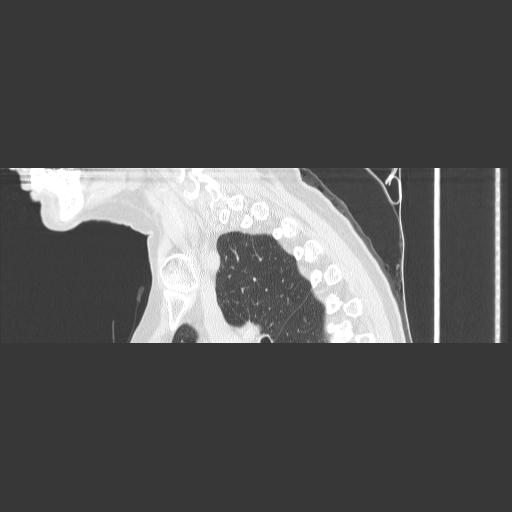
[im 71/142  lung]
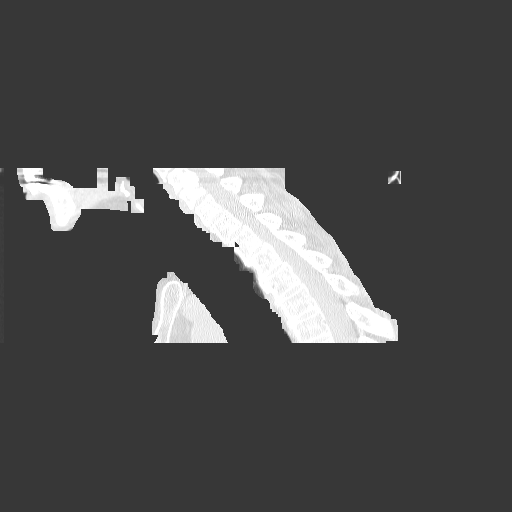
[im 79/142  mediastinal]
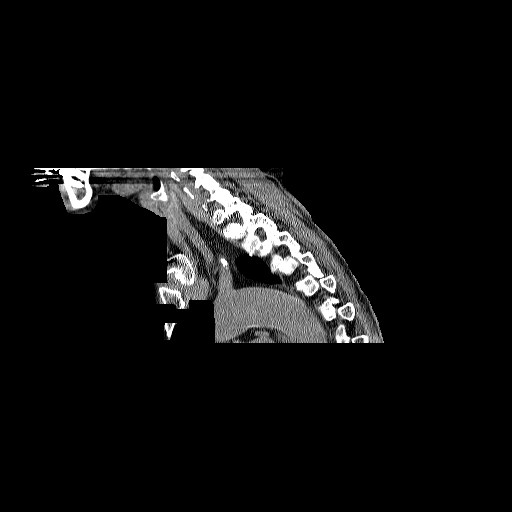
[im 79/142  lung]
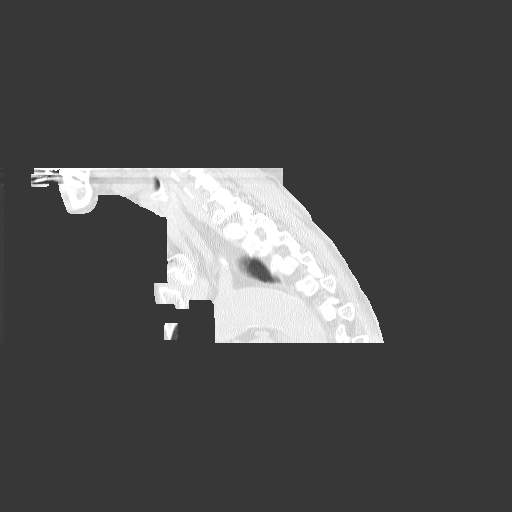
[im 87/142  lung]
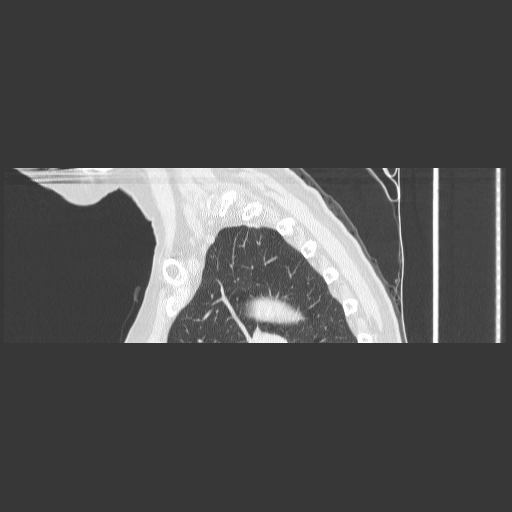
[im 95/142  lung]
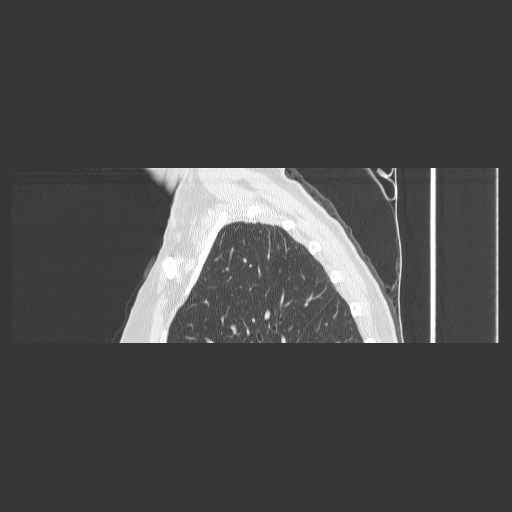
[im 110/142  lung]
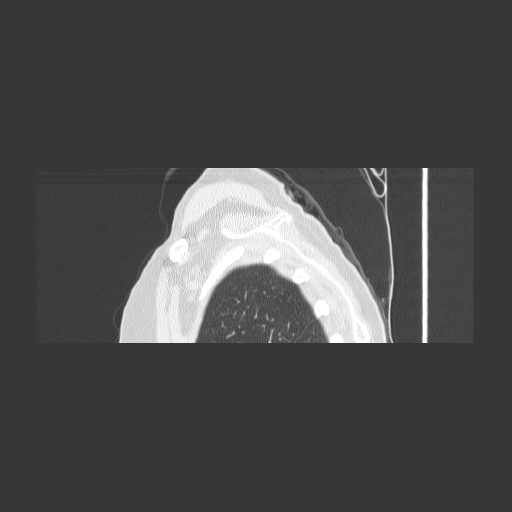
[im 118/142  mediastinal]
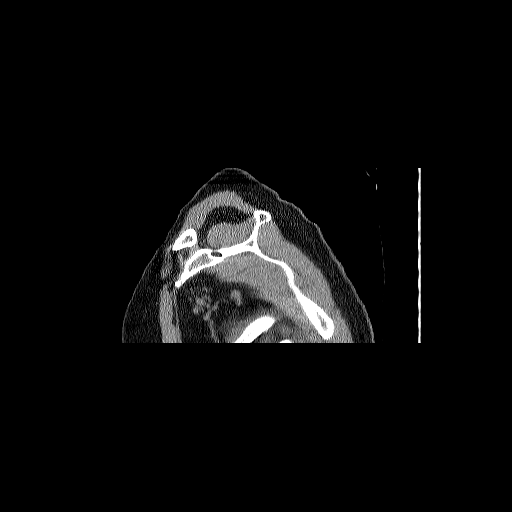
[im 118/142  lung]
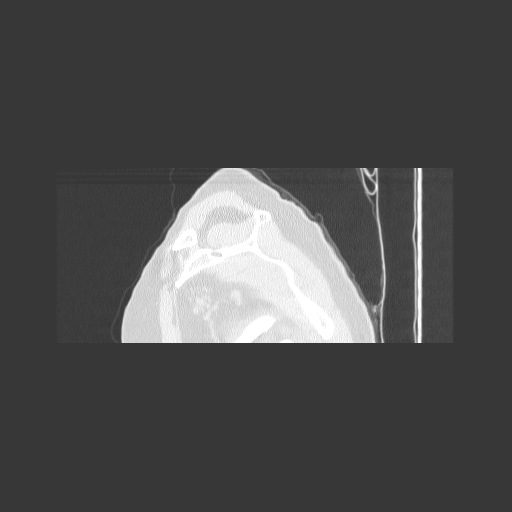
[im 126/142  lung]
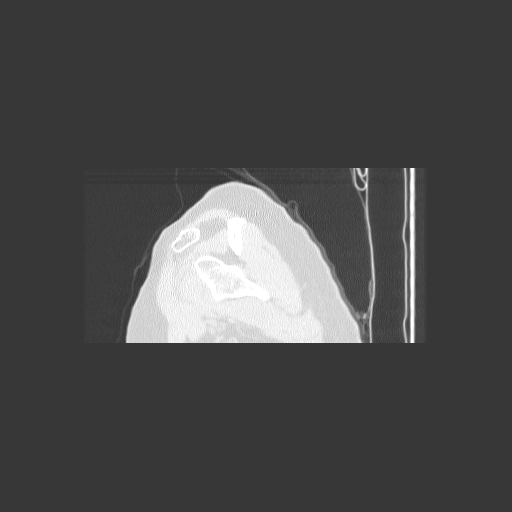
[im 134/142  lung]
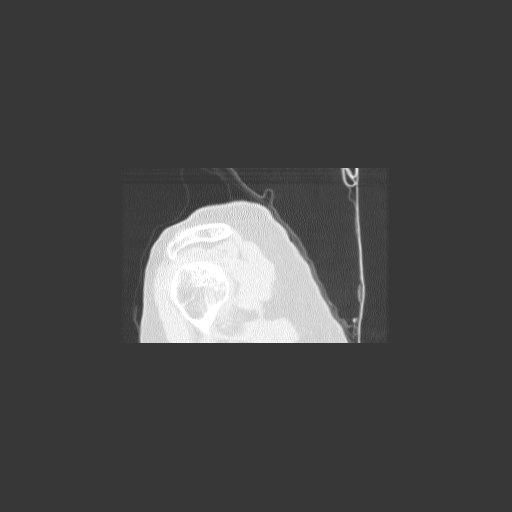

[15 of 28 positions shown; findings below may reference images not displayed]

FINDINGS: Lungs demonstrate a tiny sub solid ground-glass
attenuation nodule in the left upper lobe (image 45 series 3).
This nodule require follow-up.  Dependent atelectasis is present
within the lungs.  Central airways appear patent.

The sternoclavicular joints appear within normal limits.  Mild AC
joint osteoarthritis is present on the left.  There is no
supraclavicular adenopathy or mass lesion.  There is no visualized
axillary adenopathy.  Glenohumeral joints appear within normal
limits.  No destructive osseous lesions.  There is no rotator cuff
muscular atrophy.  Rotator cuff muscles appear symmetric
bilaterally.  Mild thoracic spondylosis is incidentally noted.
IMPRESSION: 1.  Normal symmetric supraclavicular regions bilaterally without
mass or edema.  Mild left AC joint osteoarthritis.
2.  8 mm left upper lobe ground-glass attenuation nodule.  6-month
follow-up chest CT recommended to assess for stability.

## 2014-01-17 ENCOUNTER — Other Ambulatory Visit: Payer: Self-pay | Admitting: *Deleted

## 2014-01-17 NOTE — Telephone Encounter (Signed)
Last AEX and refill 06/28/2012 x 1 year No future appt's scheduled.   Please advise.

## 2014-05-28 ENCOUNTER — Other Ambulatory Visit: Payer: Self-pay

## 2014-05-28 NOTE — Telephone Encounter (Signed)
Last AEX: 06/10/12 Last refill: 04/26/13 4 patches 2 refills Current AEX:pt is due Pt was in [place] for a while taking care of family. Please advise

## 2014-05-29 MED ORDER — ESTRADIOL 0.05 MG/24HR TD PTWK: 0.0500 mg | MEDICATED_PATCH | TRANSDERMAL | Status: AC

## 2014-05-29 MED ORDER — PROGESTERONE MICRONIZED 200 MG PO CAPS: 200.0000 mg | ORAL_CAPSULE | Freq: Every day | ORAL | Status: AC

## 2014-05-29 NOTE — Telephone Encounter (Signed)
Pt hasn't had MMG that we have copy of in several years.  Overdue to AEX.  Will RF for one month.  She will need to be seen locally by a gynecologist for additional RFs.  Also needs MMG.  No more RFs until appt and MMG done.  You can ask Judeth CornfieldStephanie to help call if needed.

## 2014-05-29 NOTE — Telephone Encounter (Signed)
Also, I did RF for Prometrium 200mg  days 1-12 monthly to take with Climara.

## 2014-05-30 NOTE — Telephone Encounter (Signed)
LMOM to contact office 

## 2014-06-03 ENCOUNTER — Other Ambulatory Visit: Payer: Self-pay | Admitting: *Deleted

## 2014-06-03 NOTE — Telephone Encounter (Signed)
LMOM to contact office. Called pt more then 2 times  Encounter closed

## 2014-06-03 NOTE — Telephone Encounter (Signed)
Incoming fax from [facility] [address] requesting Climara refill. rx refused.  Faxed back with note: Patient needs Appt.- per Dr. Rondel BatonMiller's refill encounter note 05/28/2014.
